# Patient Record
Sex: Female | Born: 1970 | Race: Black or African American | Hispanic: No | Marital: Single | Smoking: Never smoker
Health system: Southern US, Community
[De-identification: ages and names within clinical notes are randomized; demographics above are authoritative.]

## PROBLEM LIST (undated history)

## (undated) DIAGNOSIS — L309 Dermatitis, unspecified: Secondary | ICD-10-CM

---

## 2008-09-01 ENCOUNTER — Inpatient Hospital Stay (HOSPITAL_COMMUNITY): Admission: AD | Admit: 2008-09-01 | Discharge: 2008-09-01 | Payer: Self-pay | Admitting: Obstetrics & Gynecology

## 2008-09-24 ENCOUNTER — Ambulatory Visit: Payer: Self-pay | Admitting: Obstetrics & Gynecology

## 2008-09-24 ENCOUNTER — Encounter: Payer: Self-pay | Admitting: Obstetrics & Gynecology

## 2008-09-24 ENCOUNTER — Other Ambulatory Visit: Admission: RE | Admit: 2008-09-24 | Discharge: 2008-09-24 | Payer: Self-pay | Admitting: Obstetrics & Gynecology

## 2008-09-24 LAB — CONVERTED CEMR LAB
Estradiol: 288.6 pg/mL
FSH: 14 milliintl units/mL
Platelets: 282 10*3/uL (ref 150–400)
Prolactin: 8.6 ng/mL
RDW: 18.5 % — ABNORMAL HIGH (ref 11.5–15.5)
TSH: 2.194 microintl units/mL (ref 0.350–4.500)
WBC: 4.8 10*3/uL (ref 4.0–10.5)

## 2008-12-07 ENCOUNTER — Inpatient Hospital Stay (HOSPITAL_COMMUNITY): Admission: RE | Admit: 2008-12-07 | Discharge: 2008-12-09 | Payer: Self-pay | Admitting: Obstetrics and Gynecology

## 2008-12-07 ENCOUNTER — Ambulatory Visit: Payer: Self-pay | Admitting: Obstetrics & Gynecology

## 2008-12-07 ENCOUNTER — Encounter: Payer: Self-pay | Admitting: Obstetrics & Gynecology

## 2010-04-06 ENCOUNTER — Emergency Department (HOSPITAL_COMMUNITY)
Admission: EM | Admit: 2010-04-06 | Discharge: 2010-04-06 | Disposition: A | Discharge status: Home / Self Care | Payer: Medicaid Other | Attending: Emergency Medicine | Admitting: Emergency Medicine

## 2010-04-06 DIAGNOSIS — H5789 Other specified disorders of eye and adnexa: Secondary | ICD-10-CM | POA: Insufficient documentation

## 2010-04-06 DIAGNOSIS — H5710 Ocular pain, unspecified eye: Secondary | ICD-10-CM | POA: Insufficient documentation

## 2010-04-06 DIAGNOSIS — H1045 Other chronic allergic conjunctivitis: Secondary | ICD-10-CM | POA: Insufficient documentation

## 2010-04-06 DIAGNOSIS — H538 Other visual disturbances: Secondary | ICD-10-CM | POA: Insufficient documentation

## 2010-04-06 DIAGNOSIS — L259 Unspecified contact dermatitis, unspecified cause: Secondary | ICD-10-CM | POA: Insufficient documentation

## 2010-04-13 LAB — BASIC METABOLIC PANEL
BUN: 8 mg/dL (ref 6–23)
CO2: 23 mEq/L (ref 19–32)
Calcium: 9 mg/dL (ref 8.4–10.5)
Creatinine, Ser: 0.67 mg/dL (ref 0.4–1.2)
Glucose, Bld: 90 mg/dL (ref 70–99)

## 2010-04-13 LAB — MRSA CULTURE

## 2010-04-13 LAB — CBC
Hemoglobin: 10 g/dL — ABNORMAL LOW (ref 12.0–15.0)
Hemoglobin: 7.7 g/dL — ABNORMAL LOW (ref 12.0–15.0)
MCHC: 32.2 g/dL (ref 30.0–36.0)
MCHC: 32.3 g/dL (ref 30.0–36.0)
Platelets: 379 10*3/uL (ref 150–400)
RBC: 3 MIL/uL — ABNORMAL LOW (ref 3.87–5.11)
RDW: 16.2 % — ABNORMAL HIGH (ref 11.5–15.5)

## 2010-04-13 LAB — CROSSMATCH
ABO/RH(D): O POS
Antibody Screen: NEGATIVE

## 2010-04-16 LAB — POCT PREGNANCY, URINE: Preg Test, Ur: NEGATIVE

## 2010-04-16 LAB — CBC
HCT: 33.9 % — ABNORMAL LOW (ref 36.0–46.0)
Hemoglobin: 10.7 g/dL — ABNORMAL LOW (ref 12.0–15.0)
WBC: 3.5 10*3/uL — ABNORMAL LOW (ref 4.0–10.5)

## 2010-08-13 ENCOUNTER — Emergency Department (HOSPITAL_COMMUNITY)
Admission: EM | Admit: 2010-08-13 | Discharge: 2010-08-14 | Disposition: A | Discharge status: Home / Self Care | Payer: Medicaid Other | Attending: Emergency Medicine | Admitting: Emergency Medicine

## 2010-08-13 DIAGNOSIS — H11429 Conjunctival edema, unspecified eye: Secondary | ICD-10-CM | POA: Insufficient documentation

## 2010-08-13 DIAGNOSIS — L259 Unspecified contact dermatitis, unspecified cause: Secondary | ICD-10-CM | POA: Insufficient documentation

## 2010-08-13 DIAGNOSIS — H109 Unspecified conjunctivitis: Secondary | ICD-10-CM | POA: Insufficient documentation

## 2010-08-13 DIAGNOSIS — H5789 Other specified disorders of eye and adnexa: Secondary | ICD-10-CM | POA: Insufficient documentation

## 2011-05-17 ENCOUNTER — Other Ambulatory Visit: Payer: Self-pay | Admitting: *Deleted

## 2011-05-17 DIAGNOSIS — Z1231 Encounter for screening mammogram for malignant neoplasm of breast: Secondary | ICD-10-CM

## 2011-05-18 ENCOUNTER — Ambulatory Visit: Payer: Medicaid Other

## 2011-05-23 ENCOUNTER — Ambulatory Visit: Payer: Medicaid Other

## 2011-06-19 ENCOUNTER — Ambulatory Visit: Payer: Medicaid Other | Admitting: Family Medicine

## 2014-06-12 ENCOUNTER — Encounter (HOSPITAL_COMMUNITY): Payer: Self-pay | Admitting: Cardiology

## 2014-06-12 ENCOUNTER — Emergency Department (HOSPITAL_COMMUNITY)
Admission: EM | Admit: 2014-06-12 | Discharge: 2014-06-12 | Disposition: A | Discharge status: Home / Self Care | Payer: Medicaid Other | Attending: Emergency Medicine | Admitting: Emergency Medicine

## 2014-06-12 DIAGNOSIS — R21 Rash and other nonspecific skin eruption: Secondary | ICD-10-CM | POA: Diagnosis present

## 2014-06-12 DIAGNOSIS — L309 Dermatitis, unspecified: Secondary | ICD-10-CM | POA: Diagnosis not present

## 2014-06-12 HISTORY — DX: Dermatitis, unspecified: L30.9

## 2014-06-12 MED ORDER — CLOBETASOL PROPIONATE 0.05 % EX CREA: 1.0000 "application " | TOPICAL_CREAM | Freq: Two times a day (BID) | CUTANEOUS | Status: DC

## 2014-06-12 NOTE — ED Notes (Signed)
Pt reports her eczema has been flared for the past couple of days. Reports she has spent some time in the sun and has made it worse. Pt is out of medication at home.

## 2014-06-12 NOTE — Discharge Instructions (Signed)
Eczema Eczema, also called atopic dermatitis, is a skin disorder that causes inflammation of the skin. It causes a red rash and dry, scaly skin. The skin becomes very itchy. Eczema is generally worse during the cooler winter months and often improves with the warmth of summer. Eczema usually starts showing signs in infancy. Some children outgrow eczema, but it may last through adulthood.  CAUSES  The exact cause of eczema is not known, but it appears to run in families. People with eczema often have a family history of eczema, allergies, asthma, or hay fever. Eczema is not contagious. Flare-ups of the condition may be caused by:   Contact with something you are sensitive or allergic to.   Stress. SIGNS AND SYMPTOMS  Dry, scaly skin.   Red, itchy rash.   Itchiness. This may occur before the skin rash and may be very intense.  DIAGNOSIS  The diagnosis of eczema is usually made based on symptoms and medical history. TREATMENT  Eczema cannot be cured, but symptoms usually can be controlled with treatment and other strategies. A treatment plan might include:  Controlling the itching and scratching.   Use over-the-counter antihistamines as directed for itching. This is especially useful at night when the itching tends to be worse.   Use over-the-counter steroid creams as directed for itching.   Avoid scratching. Scratching makes the rash and itching worse. It may also result in a skin infection (impetigo) due to a break in the skin caused by scratching.   Keeping the skin well moisturized with creams every day. This will seal in moisture and help prevent dryness. Lotions that contain alcohol and water should be avoided because they can dry the skin.   Limiting exposure to things that you are sensitive or allergic to (allergens).   Recognizing situations that cause stress.   Developing a plan to manage stress.  HOME CARE INSTRUCTIONS   Only take over-the-counter or  prescription medicines as directed by your health care provider.   Do not use anything on the skin without checking with your health care provider.   Keep baths or showers short (5 minutes) in warm (not hot) water. Use mild cleansers for bathing. These should be unscented. You may add nonperfumed bath oil to the bath water. It is best to avoid soap and bubble bath.   Immediately after a bath or shower, when the skin is still damp, apply a moisturizing ointment to the entire body. This ointment should be a petroleum ointment. This will seal in moisture and help prevent dryness. The thicker the ointment, the better. These should be unscented.   Keep fingernails cut short. Children with eczema may need to wear soft gloves or mittens at night after applying an ointment.   Dress in clothes made of cotton or cotton blends. Dress lightly, because heat increases itching.   A child with eczema should stay away from anyone with fever blisters or cold sores. The virus that causes fever blisters (herpes simplex) can cause a serious skin infection in children with eczema. SEEK MEDICAL CARE IF:   Your itching interferes with sleep.   Your rash gets worse or is not better within 1 week after starting treatment.   You see pus or soft yellow scabs in the rash area.   You have a fever.   You have a rash flare-up after contact with someone who has fever blisters.  Document Released: 12/24/1999 Document Revised: 10/16/2012 Document Reviewed: 07/29/2012 Exit[employer] Patient Information 2015 [employer]. This information   is not intended to replace advice given to you by your health care provider. Make sure you discuss any questions you have with your health care provider.  

## 2014-06-12 NOTE — ED Provider Notes (Signed)
CSN: [id number]     Arrival date & time 06/12/14  1354 History   This chart was scribed for Carol HelperBowie Lanay Zinda, PA-C working with Doug SouSam Jacubowitz, MD by Evon Slackerrance Branch, ED Scribe. This patient was seen in room TR10C/TR10C and the patient's care was started at 2:44 PM.     Chief Complaint  Patient presents with  . Rash   Patient is a 44 y.o. female presenting with rash. The history is provided by the patient. No language interpreter was used.  Rash Associated symptoms: no fever and no shortness of breath    HPI Comments: Carol Hernandez is a 44 y.o. female with PMHx of Eczema who presents to the Emergency Department complaining of worsening itchy rash onset 1 week. Pt states that the rash has spread to her bilateral arms, hands, legs, posterior neck and lower back. Pt associates the worsening of the rash with migraine HA she has had for the past week. Pt states she also believes that the rash is worse when exposed to direct sun light. Pt states she has tried Vaseline and warm showers with no relief. Pt states she has a Hx of eczema that has relief with cortisol cream. Pt states she has recently ran out of the cream about 3 weeks prior. Pt denies any changes in soaps or detergents. Pt denies fever, cp or SOB.     Past Medical History  Diagnosis Date  . Eczema    History reviewed. No pertinent past surgical history. History reviewed. No pertinent family history. History  Substance Use Topics  . Smoking status: Never Smoker   . Smokeless tobacco: Not on file  . Alcohol Use: No   OB History    No data available     Review of Systems  Constitutional: Negative for fever and chills.  Respiratory: Negative for shortness of breath.   Cardiovascular: Negative for chest pain.  Skin: Positive for rash.    Allergies  Ibuprofen  Home Medications   Prior to Admission medications   Not on File   BP 157/96 mmHg  Pulse 72  Temp(Src) 98.4 F (36.9 C)  Resp 18  Ht 5\' 2"  (1.575 m)  SpO2 100%    Physical Exam  Constitutional: She is oriented to person, place, and time. She appears well-developed and well-nourished. No distress.  HENT:  Head: Normocephalic and atraumatic.  Eyes: Conjunctivae and EOM are normal.  Neck: Neck supple. No tracheal deviation present.  Cardiovascular: Normal rate.   Pulmonary/Chest: Effort normal and breath sounds normal. No respiratory distress. She has no wheezes. She has no rales.  Musculoskeletal: Normal range of motion.  Neurological: She is alert and oriented to person, place, and time.  Skin: Skin is warm and dry. Rash noted.  Dry patchy skin changes surrounding neck, effecting dorsum of both forearms and dorsum's of hands, posterior aspect of both lower legs and along lower back.  No rash noted in mouth, palms of hands or soles of feet  Psychiatric: She has a normal mood and affect. Her behavior is normal.  Nursing note and vitals reviewed.   ED Course  Procedures (including critical care time) DIAGNOSTIC STUDIES: Oxygen Saturation is 100% on RA, normal by my interpretation.    COORDINATION OF CARE: 3:21 PM-Discussed treatment plan with pt at bedside and pt agreed to plan.   Evidence of eczema, no signs of infection. Refill clobetasol cream as it has helped her in the past. Recommend dermatology follow-up as needed.  Labs Review Labs Reviewed -  No data to display  Imaging Review No results found.   EKG Interpretation None      MDM   Final diagnoses:  Eczema   BP 150/101 mmHg  Pulse 91  Temp(Src) 98.4 F (36.9 C) (Oral)  Resp 18  Ht  (1.575 m)  SpO2 100%   I personally performed the services described in this documentation, which was scribed in my presence. The recorded information has been reviewed and is accurate.       Carol Helper, PA-C 06/12/14 2322  Doug Sou, MD 06/13/14 [phone]

## 2014-09-23 ENCOUNTER — Emergency Department (HOSPITAL_COMMUNITY)
Admission: EM | Admit: 2014-09-23 | Discharge: 2014-09-23 | Disposition: A | Discharge status: Home / Self Care | Payer: Medicaid Other | Attending: Emergency Medicine | Admitting: Emergency Medicine

## 2014-09-23 ENCOUNTER — Encounter (HOSPITAL_COMMUNITY): Payer: Self-pay | Admitting: Neurology

## 2014-09-23 DIAGNOSIS — R21 Rash and other nonspecific skin eruption: Secondary | ICD-10-CM | POA: Diagnosis present

## 2014-09-23 DIAGNOSIS — L309 Dermatitis, unspecified: Secondary | ICD-10-CM

## 2014-09-23 DIAGNOSIS — L259 Unspecified contact dermatitis, unspecified cause: Secondary | ICD-10-CM | POA: Diagnosis not present

## 2014-09-23 MED ORDER — CLOBETASOL PROPIONATE 0.05 % EX OINT: 1.0000 "application " | TOPICAL_OINTMENT | Freq: Two times a day (BID) | CUTANEOUS | Status: AC

## 2014-09-23 NOTE — Discharge Instructions (Signed)
Eczema Eczema, also called atopic dermatitis, is a skin disorder that causes inflammation of the skin. It causes a red rash and dry, scaly skin. The skin becomes very itchy. Eczema is generally worse during the cooler winter months and often improves with the warmth of summer. Eczema usually starts showing signs in infancy. Some children outgrow eczema, but it may last through adulthood.  CAUSES  The exact cause of eczema is not known, but it appears to run in families. People with eczema often have a family history of eczema, allergies, asthma, or hay fever. Eczema is not contagious. Flare-ups of the condition may be caused by:   Contact with something you are sensitive or allergic to.   Stress. SIGNS AND SYMPTOMS  Dry, scaly skin.   Red, itchy rash.   Itchiness. This may occur before the skin rash and may be very intense.  DIAGNOSIS  The diagnosis of eczema is usually made based on symptoms and medical history. TREATMENT  Eczema cannot be cured, but symptoms usually can be controlled with treatment and other strategies. A treatment plan might include:  Controlling the itching and scratching.   Use over-the-counter antihistamines as directed for itching. This is especially useful at night when the itching tends to be worse.   Use over-the-counter steroid creams as directed for itching.   Avoid scratching. Scratching makes the rash and itching worse. It may also result in a skin infection (impetigo) due to a break in the skin caused by scratching.   Keeping the skin well moisturized with creams every day. This will seal in moisture and help prevent dryness. Lotions that contain alcohol and water should be avoided because they can dry the skin.   Limiting exposure to things that you are sensitive or allergic to (allergens).   Recognizing situations that cause stress.   Developing a plan to manage stress.  HOME CARE INSTRUCTIONS   Only take over-the-counter or  prescription medicines as directed by your health care provider.   Do not use anything on the skin without checking with your health care provider.   Keep baths or showers short (5 minutes) in warm (not hot) water. Use mild cleansers for bathing. These should be unscented. You may add nonperfumed bath oil to the bath water. It is best to avoid soap and bubble bath.   Immediately after a bath or shower, when the skin is still damp, apply a moisturizing ointment to the entire body. This ointment should be a petroleum ointment. This will seal in moisture and help prevent dryness. The thicker the ointment, the better. These should be unscented.   Keep fingernails cut short. Children with eczema may need to wear soft gloves or mittens at night after applying an ointment.   Dress in clothes made of cotton or cotton blends. Dress lightly, because heat increases itching.   A child with eczema should stay away from anyone with fever blisters or cold sores. The virus that causes fever blisters (herpes simplex) can cause a serious skin infection in children with eczema. SEEK MEDICAL CARE IF:   Your itching interferes with sleep.   Your rash gets worse or is not better within 1 week after starting treatment.   You see pus or soft yellow scabs in the rash area.   You have a fever.   You have a rash flare-up after contact with someone who has fever blisters.  Document Released: 12/24/1999 Document Revised: 10/16/2012 Document Reviewed: 07/29/2012 Exit[employer] Patient Information 2015 [employer]. This information   is not intended to replace advice given to you by your health care provider. Make sure you discuss any questions you have with your health care provider.  

## 2014-09-23 NOTE — ED Provider Notes (Signed)
CSN: [id number]     Arrival date & time 09/23/14  1524 History  This chart was scribed for Fayrene Helper, PA-C, working with Donnetta Hutching, MD by Chestine Spore, ED Scribe. The patient was seen in room TR03C/TR03C at 4:09 PM.    Chief Complaint  Patient presents with  . Eczema     The history is provided by the patient. No language interpreter was used.    Carol Hernandez is a 44 y.o. female with a medical hx of eczema who presents to the Emergency Department complaining of eczema onset 2 weeks. She reports that the eczema is to her posterior neck and back of her right knee that is itchy. Pt notes that she was not given a referral to a dermatologist at her last visit in June 2016 for her similar symptoms. Pt notes that her PCP is [facility] and she has tried to get an appointment with no success. She thinks that her eczema is worsened by sunlight. She denies any new medications/lotions/soaps/detergents. She notes that she has tried [employer], vaseline, and the prescribed temovate with relief of her symptoms. She denies wound, color change, fever, abdominal pain, throat swelling, and any other symptoms. she denies hx of DM at this time.   Past Medical History  Diagnosis Date  . Eczema    History reviewed. No pertinent past surgical history. No family history on file. Social History  Substance Use Topics  . Smoking status: Never Smoker   . Smokeless tobacco: None  . Alcohol Use: No   OB History    No data available     Review of Systems  Constitutional: Negative for fever.  HENT: Negative for trouble swallowing.   Gastrointestinal: Negative for abdominal pain.  Skin: Positive for rash. Negative for color change.      Allergies  Ibuprofen  Home Medications   Prior to Admission medications   Medication Sig Start Date End Date Taking? Authorizing Provider  clobetasol cream (TEMOVATE) 0.05 % Apply 1 application topically 2 (two) times daily. Apply to affected area twice daily as  needed.  Avoid use on face. 06/12/14   Fayrene Helper, PA-C   BP 154/99 mmHg  Pulse 96  Temp(Src) 98.4 F (36.9 C) (Oral)  Resp 16  Ht 5\' 3"  (1.6 m)  Wt 233 lb 3.2 oz (105.779 kg)  BMI 41.32 kg/m2  SpO2 100% Physical Exam  Constitutional: She is oriented to person, place, and time. She appears well-developed and well-nourished. No distress.  HENT:  Head: Normocephalic and atraumatic.  Eyes: EOM are normal.  Neck: Neck supple. No tracheal deviation present.  Cardiovascular: Normal rate, regular rhythm and normal heart sounds.  Exam reveals no gallop and no friction rub.   No murmur heard. Pulmonary/Chest: Effort normal and breath sounds normal. No respiratory distress. She has no wheezes. She has no rales.  Musculoskeletal: Normal range of motion.  Neurological: She is alert and oriented to person, place, and time.  Skin: Skin is warm and dry. Rash noted.  Dry eczema rash noted to chest, posterior neck, bilateral shoulders, bilateral forearm, and bilateral legs. No rash to soles of feet, palms, or oral region.  Psychiatric: She has a normal mood and affect. Her behavior is normal.  Nursing note and vitals reviewed.   ED Course  Procedures (including critical care time) DIAGNOSTIC STUDIES: Oxygen Saturation is 100% on RA, nl by my interpretation.    COORDINATION OF CARE: 4:14 PM- This is probably an exacerbation of patients eczema.  Will treat with Temovate and a referral to a dermatologist. Discussed treatment plan with pt at bedside which includes temovate Rx and pt agreed to plan.     MDM   Final diagnoses:  Eczema    BP 154/99 mmHg  Pulse 96  Temp(Src) 98.4 F (36.9 C) (Oral)  Resp 16  Ht  (1.6 m)  Wt 233 lb 3.2 oz (105.779 kg)  BMI 41.32 kg/m2  SpO2 100%   I personally performed the services described in this documentation, which was scribed in my presence. The recorded information has been reviewed and is accurate.     Fayrene Helper, PA-C 09/23/14  1638  Donnetta Hutching, MD 09/23/14 [phone]

## 2014-09-23 NOTE — ED Notes (Signed)
Pt reports eczema to her arms and back, is itchy.

## 2014-09-23 NOTE — ED Notes (Signed)
Rash on upper chest  And under neck

## 2015-04-20 ENCOUNTER — Emergency Department (HOSPITAL_COMMUNITY): Payer: Medicaid Other

## 2015-04-20 ENCOUNTER — Emergency Department (HOSPITAL_COMMUNITY)
Admission: EM | Admit: 2015-04-20 | Discharge: 2015-04-21 | Disposition: A | Discharge status: Home / Self Care | Payer: Medicaid Other | Attending: Emergency Medicine | Admitting: Emergency Medicine

## 2015-04-20 ENCOUNTER — Encounter (HOSPITAL_COMMUNITY): Payer: Self-pay | Admitting: Emergency Medicine

## 2015-04-20 DIAGNOSIS — Y9389 Activity, other specified: Secondary | ICD-10-CM | POA: Diagnosis not present

## 2015-04-20 DIAGNOSIS — Y998 Other external cause status: Secondary | ICD-10-CM | POA: Diagnosis not present

## 2015-04-20 DIAGNOSIS — Y9289 Other specified places as the place of occurrence of the external cause: Secondary | ICD-10-CM | POA: Diagnosis not present

## 2015-04-20 DIAGNOSIS — X58XXXA Exposure to other specified factors, initial encounter: Secondary | ICD-10-CM | POA: Diagnosis not present

## 2015-04-20 DIAGNOSIS — S63619A Unspecified sprain of unspecified finger, initial encounter: Secondary | ICD-10-CM

## 2015-04-20 DIAGNOSIS — Z872 Personal history of diseases of the skin and subcutaneous tissue: Secondary | ICD-10-CM | POA: Insufficient documentation

## 2015-04-20 DIAGNOSIS — S63612A Unspecified sprain of right middle finger, initial encounter: Secondary | ICD-10-CM | POA: Insufficient documentation

## 2015-04-20 DIAGNOSIS — S6991XA Unspecified injury of right wrist, hand and finger(s), initial encounter: Secondary | ICD-10-CM | POA: Diagnosis present

## 2015-04-20 MED ORDER — HYDROCODONE-ACETAMINOPHEN 5-325 MG PO TABS: 1.0000 | ORAL_TABLET | Freq: Four times a day (QID) | ORAL | Status: AC | PRN

## 2015-04-20 NOTE — ED Provider Notes (Signed)
History  By signing my name below, I, Karle PlumberJennifer Tensley, attest that this documentation has been prepared under the direction and in the presence of Rob RogersBrowning, [place]PA-C. Electronically Signed: Karle PlumberJennifer Tensley, ED Scribe. 04/20/2015. 11:33 PM.  Chief Complaint  Patient presents with  . Finger Injury   The history is provided by the patient and medical records. No language interpreter was used.    HPI Comments:  Carol Hernandez is a 45 y.o. obese female who presents to the Emergency Department complaining of right third digit swelling that began last night. She reports aching pain throughout her entire right hand. She denies trauma or injury but states she lifts boxes at work and may have jammed it against something and not realized it. She has not taken anything for pain but reports icing the area. Touching the area or moving the finger increases the pain. She denies alleviating factors. Pt denies numbness, tingling or weakness of the right hand or fingers, bruising, wounds, fever or chills.   Past Medical History  Diagnosis Date  . Eczema    History reviewed. No pertinent past surgical history. No family history on file. Social History  Substance Use Topics  . Smoking status: Never Smoker   . Smokeless tobacco: None  . Alcohol Use: No   OB History    No data available     Review of Systems  Constitutional: Negative for fever and chills.  Musculoskeletal: Positive for joint swelling and arthralgias.  Skin: Negative for color change and wound.  Neurological: Negative for weakness and numbness.   Allergies  Ibuprofen  Home Medications   Prior to Admission medications   Not on File   Triage Vitals: BP 156/99 mmHg  Pulse 95  Temp(Src) 98.1 F (36.7 C) (Oral)  Resp 18  Ht 5\' 2"  (1.575 m)  Wt 232 lb (105.235 kg)  BMI 42.42 kg/m2  SpO2 100% Physical Exam Physical Exam  Constitutional: Pt appears well-developed and well-nourished. No distress.  HENT:  Head:  Normocephalic and atraumatic.  Eyes: Conjunctivae are normal.  Neck: Normal range of motion.  Cardiovascular: Normal rate, regular rhythm and intact distal pulses.   Capillary refill < 3 sec  Pulmonary/Chest: Effort normal and breath sounds normal.  Musculoskeletal: Pt exhibits tenderness to palpation over right middle finger specifically at MCP and PIP. Pt exhibits no edema.  ROM: 4/5  Neurological: Pt  is alert. Coordination normal.  Sensation: 5/5 Strength: 4/5  Skin: Skin is warm and dry. Pt is not diaphoretic.  No tenting of the skin  Psychiatric: Pt has a normal mood and affect.  Nursing note and vitals reviewed.  ED Course  Procedures (including critical care time) DIAGNOSTIC STUDIES: Oxygen Saturation is 100% on RA, normal by my interpretation.   COORDINATION OF CARE: 10:37 PM- Will X-Ray right hand. Pt verbalizes understanding and agrees to plan.  Medications - No data to display   Imaging Review Dg Hand Complete Right  04/20/2015  CLINICAL DATA:  Right middle finger pain for 1 day. EXAM: RIGHT HAND - COMPLETE 3+ VIEW COMPARISON:  None. FINDINGS: There is no evidence of fracture or dislocation. There is no evidence of arthropathy or other focal bone abnormality. Soft tissues are unremarkable. IMPRESSION: Negative. Electronically Signed   By: Ellery Plunkaniel R Mitchell M.D.   On: 04/20/2015 23:22   I have personally reviewed and evaluated these images and lab results as part of my medical decision-making.    MDM   Final diagnoses:  Finger sprain, initial encounter  Patient with finger injury. She thinks that she hit her finger while at work. She may have jammed it. Plain films are negative. Will treat for finger sprain. There is no evidence of infection. Recommend primary care follow-up, or hand specialist follow-up if no primary care. Discharge to home. Return precautions given.  I personally performed the services described in this documentation, which was scribed in my  presence. The recorded information has been reviewed and is accurate.      Roxy Horseman, PA-C 04/20/15 2343  Lavera Guise, MD 04/21/15 [phone]

## 2015-04-20 NOTE — ED Notes (Signed)
Pt c/o pain and swelling to right middle finger, onset yesterday.  Pt st's she lifts boxes at work and st's she may have bumped it but does not remember

## 2015-04-20 NOTE — Discharge Instructions (Signed)
Finger Sprain °A finger sprain is a tear in one of the strong, fibrous tissues that connect the bones (ligaments) in your finger. The severity of the sprain depends on how much of the ligament is torn. The tear can be either partial or complete. °CAUSES  °Often, sprains are a result of a fall or accident. If you extend your hands to catch an object or to protect yourself, the force of the impact causes the fibers of your ligament to stretch too much. This excess tension causes the fibers of your ligament to tear. °SYMPTOMS  °You may have some loss of motion in your finger. Other symptoms include: °· Bruising. °· Tenderness. °· Swelling. °DIAGNOSIS  °In order to diagnose finger sprain, your caregiver will physically examine your finger or thumb to determine how torn the ligament is. Your caregiver may also suggest an X-ray exam of your finger to make sure no bones are broken. °TREATMENT  °If your ligament is only partially torn, treatment usually involves keeping the finger in a fixed position (immobilization) for a short period. To do this, your caregiver will apply a bandage, cast, or splint to keep your finger from moving until it heals. For a partially torn ligament, the healing process usually takes 2 to 3 weeks. °If your ligament is completely torn, you may need surgery to reconnect the ligament to the bone. After surgery a cast or splint will be applied and will need to stay on your finger or thumb for 4 to 6 weeks while your ligament heals. °HOME CARE INSTRUCTIONS °· Keep your injured finger elevated, when possible, to decrease swelling. °· To ease pain and swelling, apply ice to your joint twice a day, for 2 to 3 days: °· Put ice in a plastic bag. °· Place a towel between your skin and the bag. °· Leave the ice on for 15 minutes. °· Only take over-the-counter or prescription medicine for pain as directed by your caregiver. °· Do not wear rings on your injured finger. °· Do not leave your finger unprotected  until pain and stiffness go away (usually 3 to 4 weeks). °· Do not allow your cast or splint to get wet. Cover your cast or splint with a plastic bag when you shower or bathe. Do not swim. °· Your caregiver may suggest special exercises for you to do during your recovery to prevent or limit permanent stiffness. °SEEK IMMEDIATE MEDICAL CARE IF: °· Your cast or splint becomes damaged. °· Your pain becomes worse rather than better. °MAKE SURE YOU: °· Understand these instructions. °· Will watch your condition. °· Will get help right away if you are not doing well or get worse. °  °This information is not intended to replace advice given to you by your health care provider. Make sure you discuss any questions you have with your health care provider. °  °Document Released: 02/03/2004 Document Revised: 01/16/2014 Document Reviewed: 08/29/2010 °Elsevier Interactive Patient Education ©2016 [employer] ° °Jammed Finger °A jammed finger is an injury to the ligaments that support your finger bones. Ligaments are strong bands of tissue that connect bones and keep them in place. This injury happens when the ligaments are stretched beyond their normal range of motion (sprained). °CAUSES  °A jammed finger is caused by a hard direct hit to the tip of your finger that pushes your finger toward your hand.  °RISK FACTORS °This injury is more likely to happen if you play sports. °SYMPTOMS  °Symptoms of a jammed finger include: °·   Pain. °· Swelling. °· Discoloration and bruising around the joint. °· Difficulty bending or straightening the finger. °· Not being able to use the finger normally. °DIAGNOSIS  °A jammed finger is diagnosed with a medical history and physical exam. You may also have X-rays taken to check for a broken bone (fracture).  °TREATMENT  °Treatment for a jammed finger may include: °· Wearing a splint. °· Taping the injured finger to the fingers beside it (buddy taping). °· Medicines used to treat pain. °Depending on  the type of injury, you may have to do exercises after your finger has begun to heal. This helps you regain strength and mobility in the finger.  °HOME CARE INSTRUCTIONS  °· Take medicines only as directed by your health care provider. °· Apply ice to the injured area:   °¨ Put ice in a plastic bag.   °¨ Place a towel between your skin and the bag.   °¨ Leave the ice on for 20 minutes, 2-3 times per day. °· Raise the injured area above the level of your heart while you are sitting or lying down. °· Wear the splint or tape as directed by your health care provider. Remove it only as directed by your health care provider. °· Rest your finger until your health care provider says you can move it again. Your finger may feel stiff and painful for a while. °· Perform strengthening exercises as directed by your health care provider. It may help to start doing these exercises with your hand in a bowl of warm water. °· Keep all follow-up visits as directed by your health care provider. This is important. °SEEK MEDICAL CARE IF: °· You have pain or swelling that is getting worse. °· Your finger feels cold. °· Your finger looks out of place at the joint (deformity). °· You still cannot extend your finger after treatment. °· You have a fever. °SEEK IMMEDIATE MEDICAL CARE IF:  °· Even after loosening your splint, your finger: °¨ Is very red and swollen. °¨ Is white or blue. °¨ Feels tingly or becomes numb. °  °This information is not intended to replace advice given to you by your health care provider. Make sure you discuss any questions you have with your health care provider. °  °Document Released: 06/15/2009 Document Revised: 01/16/2014 Document Reviewed: 10/29/2013 °Elsevier Interactive Patient Education ©2016 [employer] ° °

## 2015-04-21 NOTE — ED Notes (Signed)
Patient able to ambulate independently  

## 2015-09-10 ENCOUNTER — Encounter (HOSPITAL_COMMUNITY): Payer: Self-pay | Admitting: *Deleted

## 2015-09-10 ENCOUNTER — Emergency Department (HOSPITAL_COMMUNITY)
Admission: EM | Admit: 2015-09-10 | Discharge: 2015-09-10 | Disposition: A | Discharge status: Home / Self Care | Payer: Medicaid Other | Attending: Emergency Medicine | Admitting: Emergency Medicine

## 2015-09-10 DIAGNOSIS — R030 Elevated blood-pressure reading, without diagnosis of hypertension: Secondary | ICD-10-CM | POA: Diagnosis not present

## 2015-09-10 DIAGNOSIS — L309 Dermatitis, unspecified: Secondary | ICD-10-CM | POA: Diagnosis not present

## 2015-09-10 DIAGNOSIS — R21 Rash and other nonspecific skin eruption: Secondary | ICD-10-CM | POA: Diagnosis present

## 2015-09-10 DIAGNOSIS — IMO0001 Reserved for inherently not codable concepts without codable children: Secondary | ICD-10-CM

## 2015-09-10 LAB — I-STAT CHEM 8, ED
BUN: 8 mg/dL (ref 6–20)
Calcium, Ion: 1.18 mmol/L (ref 1.15–1.40)
Chloride: 106 mmol/L (ref 101–111)
Creatinine, Ser: 0.9 mg/dL (ref 0.44–1.00)
Glucose, Bld: 91 mg/dL (ref 65–99)
HEMATOCRIT: 38 % (ref 36.0–46.0)
Hemoglobin: 12.9 g/dL (ref 12.0–15.0)
Potassium: 3.6 mmol/L (ref 3.5–5.1)
SODIUM: 143 mmol/L (ref 135–145)
TCO2: 25 mmol/L (ref 0–100)

## 2015-09-10 MED ORDER — AMLODIPINE BESYLATE 5 MG PO TABS
5.0000 mg | ORAL_TABLET | Freq: Once | ORAL | Status: AC
  Administered 2015-09-10: 5 mg via ORAL
  Filled 2015-09-10: qty 1

## 2015-09-10 MED ORDER — CLOBETASOL PROPIONATE 0.05 % EX OINT: 1.0000 "application " | TOPICAL_OINTMENT | Freq: Two times a day (BID) | CUTANEOUS | 0 refills | Status: AC

## 2015-09-10 MED ORDER — AMLODIPINE BESYLATE 5 MG PO TABS: 5.0000 mg | ORAL_TABLET | Freq: Every day | ORAL | 0 refills | Status: AC

## 2015-09-10 NOTE — ED Triage Notes (Signed)
The pt has eczema she has had a flareup of this for the past 2 weeks

## 2015-09-10 NOTE — ED Provider Notes (Signed)
[facility]-EMERGENCY DEPT Provider Note   CSN: [id number] Arrival date & time: 09/10/15  2045  By signing my name below, I, Christy Sartorius, attest that this documentation has been prepared under the direction and in the presence of [facility], PA-C. Electronically Signed: Christy Sartorius, ED Scribe. 09/10/15. 2:06 AM.  History   Chief Complaint Chief Complaint  Patient presents with  . Rash   The history is provided by the patient and medical records. No language interpreter was used.    HPI Comments:  Carol Hernandez is a 45 y.o. female who presents to the Emergency Department complaining of a pruritic eczema flair up on her abdomen, the back of her hands and her neck beginning 1-2 weeks ago.  She has had diagnosed eczema for as long as she can remember.  She is prescribed clobetasol for her eczema, however she is out of her cream so she has not been able to use it over the last two weeks. She had a BP of 173/117 in the ED today.  She states her mother had high blood pressure and her older sister is on high blood pressure medications, however she has not been diagnosed with hypertension. She has seen PCP several months ago where her BP was elevated. She states they discussed dietary changes which she has implemented but was not started on medication. She denies headache, SOB, chest pain, visual changes and abdominal pain. She has an appointment with her PCP on 09/24/15 and agrees to discuss her blood pressure at this visit.    Past Medical History:  Diagnosis Date  . Eczema     There are no active problems to display for this patient.   History reviewed. No pertinent surgical history.  OB History    No data available       Home Medications    Prior to Admission medications   Medication Sig Start Date End Date Taking? Authorizing Provider  amLODipine (NORVASC) 5 MG tablet Take 1 tablet (5 mg total) by mouth daily. 09/10/15   Chase Picket Jessye Imhoff, PA-C  clobetasol ointment  (TEMOVATE) 0.05 % Apply 1 application topically 2 (two) times daily. 09/10/15   Chase Picket Montae Stager, PA-C  HYDROcodone-acetaminophen (NORCO/VICODIN) 5-325 MG tablet Take 1 tablet by mouth every 6 (six) hours as needed. 04/20/15   Roxy Horseman, PA-C    Family History No family history on file.  Social History Social History  Substance Use Topics  . Smoking status: Never Smoker  . Smokeless tobacco: Never Used  . Alcohol use No     Allergies   Ibuprofen   Review of Systems Review of Systems  Respiratory: Negative for shortness of breath.   Cardiovascular: Negative for chest pain.  Gastrointestinal: Negative for abdominal pain.  Skin: Positive for rash.  Neurological: Negative for headaches.     Physical Exam Updated Vital Signs BP (!) 181/113 (BP Location: Left Arm)   Pulse 84   Temp 98.1 F (36.7 C) (Oral)   Resp 20   SpO2 100%   Physical Exam  Constitutional: She is oriented to person, place, and time. She appears well-developed and well-nourished. No distress.  HENT:  Head: Normocephalic and atraumatic.  Cardiovascular: Normal rate, regular rhythm, normal heart sounds and intact distal pulses.  Exam reveals no gallop and no friction rub.   No murmur heard. Pulmonary/Chest: Effort normal and breath sounds normal. No respiratory distress. She has no wheezes. She has no rales. She exhibits no tenderness.  Abdominal: Soft. She exhibits no distension.  There is no tenderness.  Musculoskeletal: She exhibits no edema.  Neurological: She is alert and oriented to person, place, and time.  Skin: Skin is warm and dry.  Dry scaly rash to bilateral hands, neck and abdomen.   Nursing note and vitals reviewed.    ED Treatments / Results   DIAGNOSTIC STUDIES:  Oxygen Saturation is 100% on RA, NML by my interpretation.    COORDINATION OF CARE:  2:06 AM Discussed treatment plan with pt at bedside and pt agreed to plan.  Labs (all labs ordered are listed, but only  abnormal results are displayed) Labs Reviewed  I-STAT CHEM 8, ED    EKG  EKG Interpretation None       Radiology No results found.  Procedures Procedures (including critical care time)  Medications Ordered in ED Medications  amLODipine (NORVASC) tablet 5 mg (5 mg Oral Given 09/10/15 2213)     Initial Impression / Assessment and Plan / ED Course  I have reviewed the triage vital signs and the nursing notes.  Pertinent labs & imaging results that were available during my care of the patient were reviewed by me and considered in my medical decision making (see chart for details).  Clinical Course   Carol Hernandez presents to ED for rash consistent with eczema. She has a history of eczema and typically on clobetasol ointment, however she has run out of this and requesting refill. Patient was extremely hypertensive in ED at 189/114 however asymptomatic. No history of diagnosed hypertension, however did have an elevated BP at PCP once and dietary changes were implemented. Chem-8 ordered and reassuring with normal kidney function. Patient started on Norvasc. She has a follow-up appointment with her PCP already scheduled in 2 weeks and I strongly encouraged her to keep this appointment and discuss blood pressure control. Reasons to return to the ED were discussed and all questions answered.   Final Clinical Impressions(s) / ED Diagnoses   Final diagnoses:  Eczema  Elevated blood pressure    New Prescriptions Discharge Medication List as of 09/10/2015 11:17 PM    START taking these medications   Details  amLODipine (NORVASC) 5 MG tablet Take 1 tablet (5 mg total) by mouth daily., Starting Fri 09/10/2015, Print    clobetasol ointment (TEMOVATE) 0.05 % Apply 1 application topically 2 (two) times daily., Starting Fri 09/10/2015, Print       I personally performed the services described in this documentation, which was scribed in my presence. The recorded information has been  reviewed and is accurate.    [facility] Pilcher Norvin Ohlin, PA-C 09/11/15 0206    Blane OharaJoshua Zavitz, MD 09/17/15 [phone]

## 2015-09-10 NOTE — Discharge Instructions (Signed)
I have refilled your topical steroid cream. Keep your scheduled appointment with your primary care provider. Your blood pressure was very high today. We obtained a chemistry panel which was normal. You were started on blood pressure medication. It is very important for you to discuss this medication with your primary care provider.  Return to ER for new or worsening symptoms, any additional concerns.

## 2017-07-19 ENCOUNTER — Other Ambulatory Visit: Payer: Self-pay | Admitting: Family Medicine

## 2017-07-19 DIAGNOSIS — Z713 Dietary counseling and surveillance: Secondary | ICD-10-CM | POA: Diagnosis not present

## 2017-07-19 DIAGNOSIS — L309 Dermatitis, unspecified: Secondary | ICD-10-CM | POA: Diagnosis not present

## 2017-07-19 DIAGNOSIS — I10 Essential (primary) hypertension: Secondary | ICD-10-CM | POA: Diagnosis not present

## 2017-07-19 DIAGNOSIS — Z1231 Encounter for screening mammogram for malignant neoplasm of breast: Secondary | ICD-10-CM

## 2017-07-19 DIAGNOSIS — E669 Obesity, unspecified: Secondary | ICD-10-CM | POA: Diagnosis not present

## 2017-08-14 ENCOUNTER — Ambulatory Visit
Admission: RE | Admit: 2017-08-14 | Discharge: 2017-08-14 | Disposition: A | Discharge status: Home / Self Care | Payer: Medicaid Other | Source: Ambulatory Visit | Attending: Family Medicine | Admitting: Family Medicine

## 2017-08-14 DIAGNOSIS — Z1231 Encounter for screening mammogram for malignant neoplasm of breast: Secondary | ICD-10-CM | POA: Diagnosis not present

## 2017-08-15 ENCOUNTER — Other Ambulatory Visit: Payer: Self-pay | Admitting: Family Medicine

## 2017-08-15 DIAGNOSIS — R928 Other abnormal and inconclusive findings on diagnostic imaging of breast: Secondary | ICD-10-CM

## 2017-08-20 ENCOUNTER — Ambulatory Visit
Admission: RE | Admit: 2017-08-20 | Discharge: 2017-08-20 | Disposition: A | Discharge status: Home / Self Care | Payer: Medicaid Other | Source: Ambulatory Visit | Attending: Family Medicine | Admitting: Family Medicine

## 2017-08-20 DIAGNOSIS — N6489 Other specified disorders of breast: Secondary | ICD-10-CM | POA: Diagnosis not present

## 2017-08-20 DIAGNOSIS — R928 Other abnormal and inconclusive findings on diagnostic imaging of breast: Secondary | ICD-10-CM | POA: Diagnosis not present

## 2017-10-31 DIAGNOSIS — L309 Dermatitis, unspecified: Secondary | ICD-10-CM | POA: Diagnosis not present

## 2017-10-31 DIAGNOSIS — E669 Obesity, unspecified: Secondary | ICD-10-CM | POA: Diagnosis not present

## 2017-10-31 DIAGNOSIS — I10 Essential (primary) hypertension: Secondary | ICD-10-CM | POA: Diagnosis not present

## 2017-10-31 DIAGNOSIS — E785 Hyperlipidemia, unspecified: Secondary | ICD-10-CM | POA: Diagnosis not present

## 2018-03-22 DIAGNOSIS — R55 Syncope and collapse: Secondary | ICD-10-CM | POA: Diagnosis not present

## 2018-03-22 DIAGNOSIS — R000 Tachycardia, unspecified: Secondary | ICD-10-CM | POA: Diagnosis not present

## 2018-03-23 ENCOUNTER — Emergency Department (HOSPITAL_COMMUNITY)
Admission: EM | Admit: 2018-03-23 | Discharge: 2018-03-23 | Disposition: A | Discharge status: Home / Self Care | Payer: Medicaid Other | Attending: Emergency Medicine | Admitting: Emergency Medicine

## 2018-03-23 ENCOUNTER — Other Ambulatory Visit: Payer: Self-pay

## 2018-03-23 ENCOUNTER — Emergency Department (HOSPITAL_COMMUNITY): Payer: Medicaid Other

## 2018-03-23 ENCOUNTER — Encounter (HOSPITAL_COMMUNITY): Payer: Self-pay | Admitting: Emergency Medicine

## 2018-03-23 DIAGNOSIS — M79672 Pain in left foot: Secondary | ICD-10-CM | POA: Diagnosis not present

## 2018-03-23 DIAGNOSIS — X501XXA Overexertion from prolonged static or awkward postures, initial encounter: Secondary | ICD-10-CM | POA: Diagnosis not present

## 2018-03-23 DIAGNOSIS — Z79899 Other long term (current) drug therapy: Secondary | ICD-10-CM | POA: Insufficient documentation

## 2018-03-23 DIAGNOSIS — Y999 Unspecified external cause status: Secondary | ICD-10-CM | POA: Insufficient documentation

## 2018-03-23 DIAGNOSIS — R42 Dizziness and giddiness: Secondary | ICD-10-CM | POA: Diagnosis not present

## 2018-03-23 DIAGNOSIS — Y939 Activity, unspecified: Secondary | ICD-10-CM | POA: Diagnosis not present

## 2018-03-23 DIAGNOSIS — R55 Syncope and collapse: Secondary | ICD-10-CM | POA: Diagnosis not present

## 2018-03-23 DIAGNOSIS — R000 Tachycardia, unspecified: Secondary | ICD-10-CM | POA: Diagnosis not present

## 2018-03-23 DIAGNOSIS — S99922A Unspecified injury of left foot, initial encounter: Secondary | ICD-10-CM | POA: Diagnosis not present

## 2018-03-23 DIAGNOSIS — M25562 Pain in left knee: Secondary | ICD-10-CM | POA: Diagnosis not present

## 2018-03-23 DIAGNOSIS — Y929 Unspecified place or not applicable: Secondary | ICD-10-CM | POA: Diagnosis not present

## 2018-03-23 DIAGNOSIS — M25572 Pain in left ankle and joints of left foot: Secondary | ICD-10-CM | POA: Diagnosis not present

## 2018-03-23 DIAGNOSIS — S92302A Fracture of unspecified metatarsal bone(s), left foot, initial encounter for closed fracture: Secondary | ICD-10-CM | POA: Insufficient documentation

## 2018-03-23 DIAGNOSIS — I10 Essential (primary) hypertension: Secondary | ICD-10-CM | POA: Insufficient documentation

## 2018-03-23 LAB — BASIC METABOLIC PANEL
Anion gap: 8 (ref 5–15)
BUN: 8 mg/dL (ref 6–20)
CO2: 24 mmol/L (ref 22–32)
Calcium: 9.8 mg/dL (ref 8.9–10.3)
Chloride: 103 mmol/L (ref 98–111)
Creatinine, Ser: 0.89 mg/dL (ref 0.44–1.00)
Glucose, Bld: 74 mg/dL (ref 70–99)
Potassium: 3.8 mmol/L (ref 3.5–5.1)
Sodium: 135 mmol/L (ref 135–145)

## 2018-03-23 LAB — CBC WITH DIFFERENTIAL/PLATELET
Abs Immature Granulocytes: 0.02 10*3/uL (ref 0.00–0.07)
BASOS ABS: 0 10*3/uL (ref 0.0–0.1)
BASOS PCT: 0 %
Eosinophils Absolute: 0 10*3/uL (ref 0.0–0.5)
Eosinophils Relative: 1 %
HCT: 43.9 % (ref 36.0–46.0)
Hemoglobin: 13.2 g/dL (ref 12.0–15.0)
Immature Granulocytes: 0 %
Lymphocytes Relative: 24 %
Lymphs Abs: 1.3 10*3/uL (ref 0.7–4.0)
MCH: 26.5 pg (ref 26.0–34.0)
MCHC: 30.1 g/dL (ref 30.0–36.0)
MCV: 88.2 fL (ref 80.0–100.0)
Monocytes Absolute: 0.7 10*3/uL (ref 0.1–1.0)
Monocytes Relative: 13 %
Neutro Abs: 3.4 10*3/uL (ref 1.7–7.7)
Neutrophils Relative %: 62 %
Platelets: 406 10*3/uL — ABNORMAL HIGH (ref 150–400)
RBC: 4.98 MIL/uL (ref 3.87–5.11)
RDW: 13.2 % (ref 11.5–15.5)
WBC: 5.5 10*3/uL (ref 4.0–10.5)
nRBC: 0 % (ref 0.0–0.2)

## 2018-03-23 LAB — D-DIMER, QUANTITATIVE: D-Dimer, Quant: 3.95 ug/mL-FEU — ABNORMAL HIGH (ref 0.00–0.50)

## 2018-03-23 LAB — CBG MONITORING, ED
GLUCOSE-CAPILLARY: 115 mg/dL — AB (ref 70–99)
Glucose-Capillary: 68 mg/dL — ABNORMAL LOW (ref 70–99)

## 2018-03-23 MED ORDER — ACETAMINOPHEN 325 MG PO TABS
650.0000 mg | ORAL_TABLET | Freq: Once | ORAL | Status: AC
  Administered 2018-03-23: 650 mg via ORAL
  Filled 2018-03-23: qty 2

## 2018-03-23 MED ORDER — IOHEXOL 350 MG/ML SOLN
80.0000 mL | Freq: Once | INTRAVENOUS | Status: AC | PRN
  Administered 2018-03-23: 80 mL via INTRAVENOUS

## 2018-03-23 NOTE — ED Provider Notes (Signed)
[facility] EMERGENCY DEPARTMENT Provider Note   CSN: [id number] Arrival date & time: 03/23/18  1045    History   Chief Complaint Chief Complaint  Patient presents with   Foot Pain   Loss of Consciousness    HPI Carol Hernandez is a 48 y.o. female with a PMH of HTN and eczema presenting with a syncopal episode 3 days ago. Patient states syncopal episode while at work. Patient reports she is unsure how long the episode lasted or if she hit her head. Patient denies a personal or family history of cardiac problems. Patient reports intermittent lightheadedness. Patient denies headaches, chest pain, shortness of breath, nausea, vomiting, or abdominal pain. Patient denies fever, neck pain, chills, cough, or congestion. Patient reports she twisted her left ankle when she fell. Patient reports left ankle pain since fall. Patient describes pain as throbbing and states it is worse with movement. Patient states she is not able to ambulate due to left ankle pain. Patient denies taking any medications or applying ice. Patient states pain radiates up to left knee. Patient reports ankle and foot edema. Patient denies numbness, paresthesias, or weakness.      HPI  Past Medical History:  Diagnosis Date   Eczema     There are no active problems to display for this patient.   History reviewed. No pertinent surgical history.   OB History   No obstetric history on file.      Home Medications    Prior to Admission medications   Medication Sig Start Date End Date Taking? Authorizing Provider  amLODipine (NORVASC) 5 MG tablet Take 1 tablet (5 mg total) by mouth daily. 09/10/15   Ward, Chase Picket, PA-C  clobetasol ointment (TEMOVATE) 0.05 % Apply 1 application topically 2 (two) times daily. 09/10/15   Ward, Chase Picket, PA-C  HYDROcodone-acetaminophen (NORCO/VICODIN) 5-325 MG tablet Take 1 tablet by mouth every 6 (six) hours as needed. 04/20/15   Roxy Horseman, PA-C     Family History No family history on file.  Social History Social History   Tobacco Use   Smoking status: Never Smoker   Smokeless tobacco: Never Used  Substance Use Topics   Alcohol use: No   Drug use: No     Allergies   Ibuprofen   Review of Systems Review of Systems  Constitutional: Negative for activity change, appetite change, chills, diaphoresis, fatigue, fever and unexpected weight change.  HENT: Negative for congestion and rhinorrhea.   Eyes: Negative for visual disturbance.  Respiratory: Negative for cough and shortness of breath.   Cardiovascular: Negative for chest pain, palpitations and leg swelling.  Gastrointestinal: Negative for abdominal pain, blood in stool, diarrhea, nausea and vomiting.  Genitourinary: Negative for dysuria.  Musculoskeletal: Positive for arthralgias, gait problem and joint swelling.  Skin: Negative for pallor and wound.  Allergic/Immunologic: Negative for immunocompromised state.  Neurological: Positive for dizziness, syncope and light-headedness. Negative for seizures, speech difficulty, weakness, numbness and headaches.  Hematological: Does not bruise/bleed easily.  Psychiatric/Behavioral: Negative for sleep disturbance. The patient is not nervous/anxious.    Physical Exam Updated Vital Signs BP 132/81 (BP Location: Left Arm)    Pulse (!) 103    Temp 98.2 F (36.8 C) (Oral)    Resp 18    Ht  (1.575 m)    SpO2 100%    BMI 42.43 kg/m   Physical Exam Vitals signs and nursing note reviewed.  Constitutional:      General: She is not in  acute distress.    Appearance: She is well-developed. She is not diaphoretic.  HENT:     Head: Normocephalic and atraumatic.  Cardiovascular:     Rate and Rhythm: Normal rate and regular rhythm.     Heart sounds: Normal heart sounds. No murmur. No friction rub. No gallop.   Pulmonary:     Effort: Pulmonary effort is normal. No respiratory distress.     Breath sounds: Normal breath  sounds. No wheezing or rales.  Abdominal:     Palpations: Abdomen is soft.     Tenderness: There is no abdominal tenderness.  Musculoskeletal:     Left hip: Normal. She exhibits normal range of motion, normal strength and no tenderness.     Right knee: Normal. She exhibits normal range of motion, no swelling, no effusion and no ecchymosis. No tenderness found.     Left knee: She exhibits bony tenderness. She exhibits normal range of motion, no swelling, no erythema, normal alignment, no LCL laxity, normal meniscus and no MCL laxity. Tenderness found. No medial joint line, no lateral joint line, no MCL, no LCL and no patellar tendon tenderness noted.     Right ankle: Normal. She exhibits normal range of motion, no swelling, no ecchymosis and no deformity.     Left ankle: She exhibits decreased range of motion and swelling. She exhibits no ecchymosis, no deformity, no laceration and normal pulse. Tenderness. Lateral malleolus and medial malleolus tenderness found. No AITFL, no CF ligament, no posterior TFL, no head of 5th metatarsal and no proximal fibula tenderness found. Achilles tendon exhibits no pain.     Right foot: Normal. Normal range of motion. No tenderness, bony tenderness or swelling.     Left foot: Decreased range of motion. Tenderness, bony tenderness and swelling present.     Comments: Mild edema noted on dorsal aspect of left foot and lateral left ankle. No edema or skin abnormality noted on left knee. No bruising or erythema noted on left ankle or foot.Tenderness to palpation of anterior knee. Negative anterior/posterior drawer test. Negative valgus/varus stress test. Diffuse tenderness to palpation of left ankle and foot. Decreased ROM and strength due to pain. Sensation intact. 2+ DP pulses.   Skin:    General: Skin is warm.     Findings: No erythema or rash.  Neurological:     Mental Status: She is alert and oriented to person, place, and time.    Mental Status:  Alert,  oriented, thought content appropriate, able to give a coherent history. Speech fluent without evidence of aphasia. Able to follow 2 step commands without difficulty.  Cranial Nerves:  II:  Peripheral visual fields grossly normal, pupils equal, round, reactive to light III,IV, VI: ptosis not present, extra-ocular motions intact bilaterally  V,VII: smile symmetric, facial light touch sensation equal VIII: hearing grossly normal to voice  X: uvula elevates symmetrically  XI: bilateral shoulder shrug symmetric and strong XII: midline tongue extension without fassiculations Motor:  Normal tone. 5/5 in upper extremities and right lower extremity including strong and equal grip strength and dorsiflexion/plantar flexion. 4/5 strength in left lower extremity due to pain.  Sensory:  light touch normal in all extremities.  Deep Tendon Reflexes: 2+ and symmetric in the biceps and patella Cerebellar: normal finger-to-nose with bilateral upper extremities Gait: patient is not able to ambulate due to left ankle/foot pain. CV: distal pulses palpable throughout   ED Treatments / Results  Labs (all labs ordered are listed, but only abnormal results are displayed)  Labs Reviewed  CBC WITH DIFFERENTIAL/PLATELET - Abnormal; Notable for the following components:      Result Value   Platelets 406 (*)    All other components within normal limits  D-DIMER, QUANTITATIVE (NOT AT [facility]) - Abnormal; Notable for the following components:   D-Dimer, Quant 3.95 (*)    All other components within normal limits  CBG MONITORING, ED - Abnormal; Notable for the following components:   Glucose-Capillary 68 (*)    All other components within normal limits  CBG MONITORING, ED - Abnormal; Notable for the following components:   Glucose-Capillary 115 (*)    All other components within normal limits  BASIC METABOLIC PANEL    EKG EKG Interpretation  Date/Time:  Saturday March 23 2018 10:56:44 EDT Ventricular Rate:   114 PR Interval:  156 QRS Duration: 84 QT Interval:  324 QTC Calculation: 446 R Axis:   43 Text Interpretation:  Sinus tachycardia Otherwise normal ECG agree, no old Confirmed by Arby Barrette [phone]) on 03/23/2018 1:25:48 PM   Radiology Dg Ankle Complete Left  Result Date: 03/23/2018 CLINICAL DATA:  Patient with leg pain since Thursday. EXAM: LEFT ANKLE COMPLETE - 3+ VIEW COMPARISON:  None. FINDINGS: Normal anatomic alignment. No evidence for acute fracture or dislocation. Talar dome is intact. IMPRESSION: No acute fracture. Electronically Signed   By: Annia Belt M.D.   On: 03/23/2018 12:32   Ct Angio Chest Pe W/cm &/or Wo Cm  Result Date: 03/23/2018 CLINICAL DATA:  Syncope with elevated D-dimer EXAM: CT ANGIOGRAPHY CHEST WITH CONTRAST TECHNIQUE: Multidetector CT imaging of the chest was performed using the standard protocol during bolus administration of intravenous contrast. Multiplanar CT image reconstructions and MIPs were obtained to evaluate the vascular anatomy. CONTRAST:  80mL OMNIPAQUE IOHEXOL 350 MG/ML SOLN COMPARISON:  None. FINDINGS: Cardiovascular: There is no demonstrable pulmonary embolus. There is no thoracic aortic aneurysm or dissection. The visualized great vessels appear unremarkable. There is no pericardial effusion or pericardial thickening. There are occasional foci of coronary artery calcification. There is left ventricular hypertrophy. Mediastinum/Nodes: Thyroid appears normal. There is no appreciable thoracic adenopathy. There is no appreciable esophageal lesion. Lungs/Pleura: There is no edema or consolidation. Areas of slight lower lobe atelectatic change noted. There is no pleural effusion or pleural thickening evident. Upper Abdomen: Visualized upper abdominal structures appear normal. Note that there is slight elevation of the left hemidiaphragm. Musculoskeletal: There are no blastic or lytic bone lesions. No chest wall lesions are evident. Review of the MIP images  confirms the above findings. IMPRESSION: 1. No demonstrable pulmonary embolus. No thoracic aortic aneurysm or dissection. 2. Occasional foci of coronary artery calcification noted. There is left ventricular hypertrophy. 3.  Slight bibasilar atelectasis.  No edema or consolidation. 4.  No appreciable thoracic adenopathy. Electronically Signed   By: Bretta Bang III M.D.   On: 03/23/2018 15:18   Dg Knee Complete 4 Views Left  Result Date: 03/23/2018 CLINICAL DATA:  Left knee pain status post fall. EXAM: LEFT KNEE - COMPLETE 4+ VIEW COMPARISON:  None. FINDINGS: Normal anatomic alignment. No evidence for acute fracture or dislocation. Regional soft tissues are unremarkable. IMPRESSION: No acute osseous abnormality. Electronically Signed   By: Annia Belt M.D.   On: 03/23/2018 12:24   Dg Foot Complete Left  Result Date: 03/23/2018 CLINICAL DATA:  Patient status post fall.  Pain.  Initial encounter. EXAM: LEFT FOOT - COMPLETE 3+ VIEW COMPARISON:  None. FINDINGS: There is cortical irregularity of the distal second, third and fourth  metatarsal diaphysis concerning for oblique fractures. Possibility of angulated fracture involving the fifth metatarsal head not excluded. Midfoot degenerative changes. No evidence for associated acute fractures. Regional soft tissues are unremarkable. IMPRESSION: Findings suggestive of oblique fractures through the distal aspect of the second, third and fourth metatarsals. Possible fifth metatarsal head fracture. Electronically Signed   By: Annia Belt M.D.   On: 03/23/2018 12:30    Procedures Procedures (including critical care time)  Medications Ordered in ED Medications  acetaminophen (TYLENOL) tablet 650 mg (650 mg Oral Given 03/23/18 1214)  iohexol (OMNIPAQUE) 350 MG/ML injection 80 mL (80 mLs Intravenous Contrast Given 03/23/18 1457)     Initial Impression / Assessment and Plan / ED Course  I have reviewed the triage vital signs and the nursing notes.  Pertinent  labs & imaging results that were available during my care of the patient were reviewed by me and considered in my medical decision making (see chart for details).  Clinical Course as of Mar 22 1524  Sat Mar 23, 2018  1142 Hypoglycemia noted at 75. Will provide orange juice.  Glucose-Capillary(!): 68 [AH]  1218 Blood glucose improved to 115 with orange juice.   Glucose-Capillary(!): 115 [AH]  1227 No acute osseous abnormality noted on left knee x ray.   DG Knee Complete 4 Views Left [AH]  1259 X ray reveals findings suggestive of oblique fractures through the distal aspect of the second, third and fourth metatarsals. Possible fifth metatarsal head fracture.    DG Foot Complete Left [AH]  1259 No acute fracture noted on ankle x ray.  DG Ankle Complete Left [AH]  1522 No demonstrable pulmonary embolus. No thoracic aortic aneurysm or dissection noted on CTA.    CT Angio Chest PE W/Cm &/Or Wo Cm [AH]    Clinical Course User Index [AH] Leretha Dykes, PA-C      Patient presents after a syncopal episode. Neurological exam is normal. Patient without arrhythmia while here in the department.  Patient without history of congestive heart failure, normal hematocrit, EKG reveals sinus tachycardia, no shortness of breath and systolic blood pressure greater than 90. Hypoglycemia noted at 68 and provided orange juice. Blood glucose improved to 115. Hypoglycemia may have caused episode of syncope. Patient denies chest pain or shortness of breath, but concern for PE due to syncopal episode and tachycardia. D dimer was elevated. Ordered CTA. CTA is negative. Will plan for discharge home with close PCP follow-up.  Possibility of recurrent syncope has been discussed. I discussed reasons to avoid driving until PCP follow up and other safety prevention including use of ladders and working at heights.   Pt has remained hemodynamically stable throughout their time in the ED  BP 132/81 (BP Location: Left Arm)     Pulse (!) 103    Temp 98.2 F (36.8 C) (Oral)    Resp 18    Ht 5\' 2"  (1.575 m)    SpO2 100%    BMI 42.43 kg/m   Patient x-ray with oblique fractures through the distal aspect of 2nd, 3rd, and 4th metatarsals of left foot. Possible left metatarsal head fracture. Pain managed in ED. Pt advised to follow up with orthopedics for further evaluation and treatment. Patient given Cam Walker and crutches while in ED, conservative therapy recommended and discussed.  The patient was discussed with and seen by Dr. Donnald Garre who agrees with the treatment plan.  Final Clinical Impressions(s) / ED Diagnoses   Final diagnoses:  Syncope and collapse  Left  foot pain  Closed nondisplaced fracture of metatarsal bone of left foot, unspecified metatarsal, initial encounter    ED Discharge Orders    None       Leretha Dykes, [place] 03/23/18 1527    Arby Barrette, MD 03/24/18 1212

## 2018-03-23 NOTE — Discharge Instructions (Addendum)
You have been seen today for loss of consciousness and left foot pain. Please read and follow all provided instructions.   1. Medications: tylenol/ibuprofen for pain, usual home medications 2. Treatment: rest, drink plenty of fluids, wear boot, do not apply pressure and use crutches 3. Follow Up: Please call and follow up with orthopedics due to fractures in foot. Please follow up with your primary doctor in 2 days for discussion of your diagnoses and further evaluation after today's visit; if you do not have a primary care doctor use the resource guide provided to find one; Please return to the ER for any new or worsening symptoms. Please obtain all of your results from medical records or have your doctors office obtain the results - share them with your doctor - you should be seen at your doctors office. Call today to arrange your follow up.   Take medications as prescribed. Please review all of the medicines and only take them if you do not have an allergy to them. Return to the emergency room for worsening condition or new concerning symptoms. Follow up with your regular doctor. If you don't have a regular doctor use one of the numbers below to establish a primary care doctor.  Please be aware that if you are taking birth control pills, taking other prescriptions, ESPECIALLY ANTIBIOTICS may make the birth control ineffective - if this is the case, either do not engage in sexual activity or use alternative methods of birth control such as condoms until you have finished the medicine and your family doctor says it is OK to restart them. If you are on a blood thinner such as COUMADIN, be aware that any other medicine that you take may cause the coumadin to either work too much, or not enough - you should have your coumadin level rechecked in next 7 days if this is the case.  ?  It is also a possibility that you have an allergic reaction to any of the medicines that you have been prescribed - Everybody  reacts differently to medications and while MOST people have no trouble with most medicines, you may have a reaction such as nausea, vomiting, rash, swelling, shortness of breath. If this is the case, please stop taking the medicine immediately and contact your physician.  ?  You should return to the ER if you develop severe or worsening symptoms.   Emergency Department Resource Guide 1) Find a Doctor and Pay Out of Pocket Although you won't have to find out who is covered by your insurance plan, it is a good idea to ask around and get recommendations. You will then need to call the office and see if the doctor you have chosen will accept you as a new patient and what types of options they offer for patients who are self-pay. Some doctors offer discounts or will set up payment plans for their patients who do not have insurance, but you will need to ask so you aren't surprised when you get to your appointment.  2) Contact Your Local Health Department Not all health departments have doctors that can see patients for sick visits, but many do, so it is worth a call to see if yours does. If you don't know where your local health department is, you can check in your phone book. The CDC also has a tool to help you locate your state's health department, and many state websites also have listings of all of their local health departments.  3) Find a  Walk-in Clinic If your illness is not likely to be very severe or complicated, you may want to try a walk in clinic. These are popping up all over the country in pharmacies, drugstores, and shopping centers. They're usually staffed by nurse practitioners or physician assistants that have been trained to treat common illnesses and complaints. They're usually fairly quick and inexpensive. However, if you have serious medical issues or chronic medical problems, these are probably not your best option.  No Primary Care Doctor: Call [facility] at  [phone] - they can  help you locate a primary care doctor that  accepts your insurance, provides certain services, etc. Physician Referral [phone]  Emergency Department Resource Guide 1) Find a Doctor and Pay Out of Pocket Although you won't have to find out who is covered by your insurance plan, it is a good idea to ask around and get recommendations. You will then need to call the office and see if the doctor you have chosen will accept you as a new patient and what types of options they offer for patients who are self-pay. Some doctors offer discounts or will set up payment plans for their patients who do not have insurance, but you will need to ask so you aren't surprised when you get to your appointment.  2) Contact Your Local Health Department Not all health departments have doctors that can see patients for sick visits, but many do, so it is worth a call to see if yours does. If you don't know where your local health department is, you can check in your phone book. The CDC also has a tool to help you locate your state's health department, and many state websites also have listings of all of their local health departments.  3) Find a Walk-in Clinic If your illness is not likely to be very severe or complicated, you may want to try a walk in clinic. These are popping up all over the country in pharmacies, drugstores, and shopping centers. They're usually staffed by nurse practitioners or physician assistants that have been trained to treat common illnesses and complaints. They're usually fairly quick and inexpensive. However, if you have serious medical issues or chronic medical problems, these are probably not your best option.  No Primary Care Doctor: Call [facility] Connect at  [phone] - they can help you locate a primary care doctor that  accepts your insurance, provides certain services, etc. Physician Referral Service- [phone]  Chronic Pain Problems: Organization         Address  Phone    Notes  [facility]  [phone] Patients need to be referred by their primary care doctor.   Medication Assistance: Organization         Address  Phone   Notes  [facility] Medication [facility] [address] [place] [place], [place] [address] [phone] --Must be a resident of [facility] -- Must have NO insurance coverage whatsoever (no Medicaid/ Medicare, etc.) -- The pt. MUST have a primary care doctor that directs their care regularly and follows them in the community   [facility]  [phone]   [employer]  [phone]    Agencies that provide inexpensive medical care: Organization         Address  Phone   Notes  [facility]  [phone]   [facility]    [phone]   [facility] [address] [place],  [place] [address] [phone]   [facility] [address] [place] [phone]   [facility]    [phone]   [facility]    [phone]   [facility]  [address], [place] Phone:  [phone], Fax:  [phone] Hours of Operation:  9 am - 6 pm, M-F.  Also accepts Medicaid/Medicare and self-pay.  [facility]  [address], [place] Phone: [phone], Fax: [phone]. Hours of Operation:  8:30 am - 5:30 pm, M-F.  Also accepts Medicaid and self-pay.  [facility] [place] [address], [place] Phone: [phone]   [facility] [address] Natasha Bence [place], [place] [phone], Ext. [phone] Mondays & Thursdays: 7-9 AM.  First 15 patients are seen on a first come, first serve basis.    Medicaid-accepting [facility] Providers:  Organization         Address  Phone   Notes  [facility] [address], [place] [phone] Also accepts self-pay patients.  [facility]  [address] Laurell Josephs [place], [place], [place]  [phone]   [facility] [address], [place] [phone]   [facility] [address], [place] [phone]   Renaye Rakers [address], [place]   [phone] Only accepts [place] patients after they have their name applied to their card.   Self-Pay (no insurance) in [facility]:  Organization         Address  Phone   Notes  Sickle Cell Patients, [facility] [address] [place], [place] [phone]   [facility] [address] [place], [place] [phone]   [facility] [place]  [address], [place] [phone]   [facility]/Dr. Osei-Bonsu  [address], [place] or [address], [place] [phone] Phone number for both [place] and [place] locations is the same.  [facility] [address] [place] [phone]   [facility] [address], [place] or [address] [phone]   [facility] [address] [place] [phone], phone; [phone], fax Sees patients 1st and 3rd Saturday of every month.  Must not qualify for public or private insurance (i.e. Medicaid, Medicare, [place] [employer] Choice, Veterans' Benefits)  Household income should be no more than 200% of the poverty level The clinic cannot treat you if you are pregnant or think you are pregnant  Sexually transmitted diseases are not treated at the clinic.

## 2018-03-23 NOTE — ED Notes (Signed)
Pt away from room at imaging, will obtain labs, cbg, and vs upon return

## 2018-03-23 NOTE — Progress Notes (Signed)
Orthopedic Tech Progress Note Patient Details:  Carol Hernandez 06/27/1970 [id number]  Ortho Devices Type of Ortho Device: Crutches, CAM walker Ortho Device/Splint Location: LLE Ortho Device/Splint Interventions: Adjustment, Application, Ordered   Post Interventions Patient Tolerated: Well, Ambulated well Instructions Provided: Poper ambulation with device, Care of device, Adjustment of device   Donald Pore 03/23/2018, 1:44 PM

## 2018-03-23 NOTE — ED Notes (Signed)
Patient transported to CT 

## 2018-03-23 NOTE — ED Triage Notes (Signed)
Pt. Stated, Carol Hernandez had left foot pain since  Thursday not sure why. I did fall out on Thursday. And Im not sure if my foot was involved. Today it hurts and is swollen (left)

## 2018-03-23 NOTE — ED Notes (Signed)
Pt returned from CT °

## 2019-12-12 DIAGNOSIS — Z634 Disappearance and death of family member: Secondary | ICD-10-CM | POA: Diagnosis not present

## 2020-04-04 IMAGING — DX LEFT KNEE - COMPLETE 4+ VIEW
4 series · 4 of 4 positions shown · non-contrast
Comparison: None.

CLINICAL DATA: Left knee pain status post fall.

EXAM:
LEFT KNEE - COMPLETE 4+ VIEW

[knee ap]
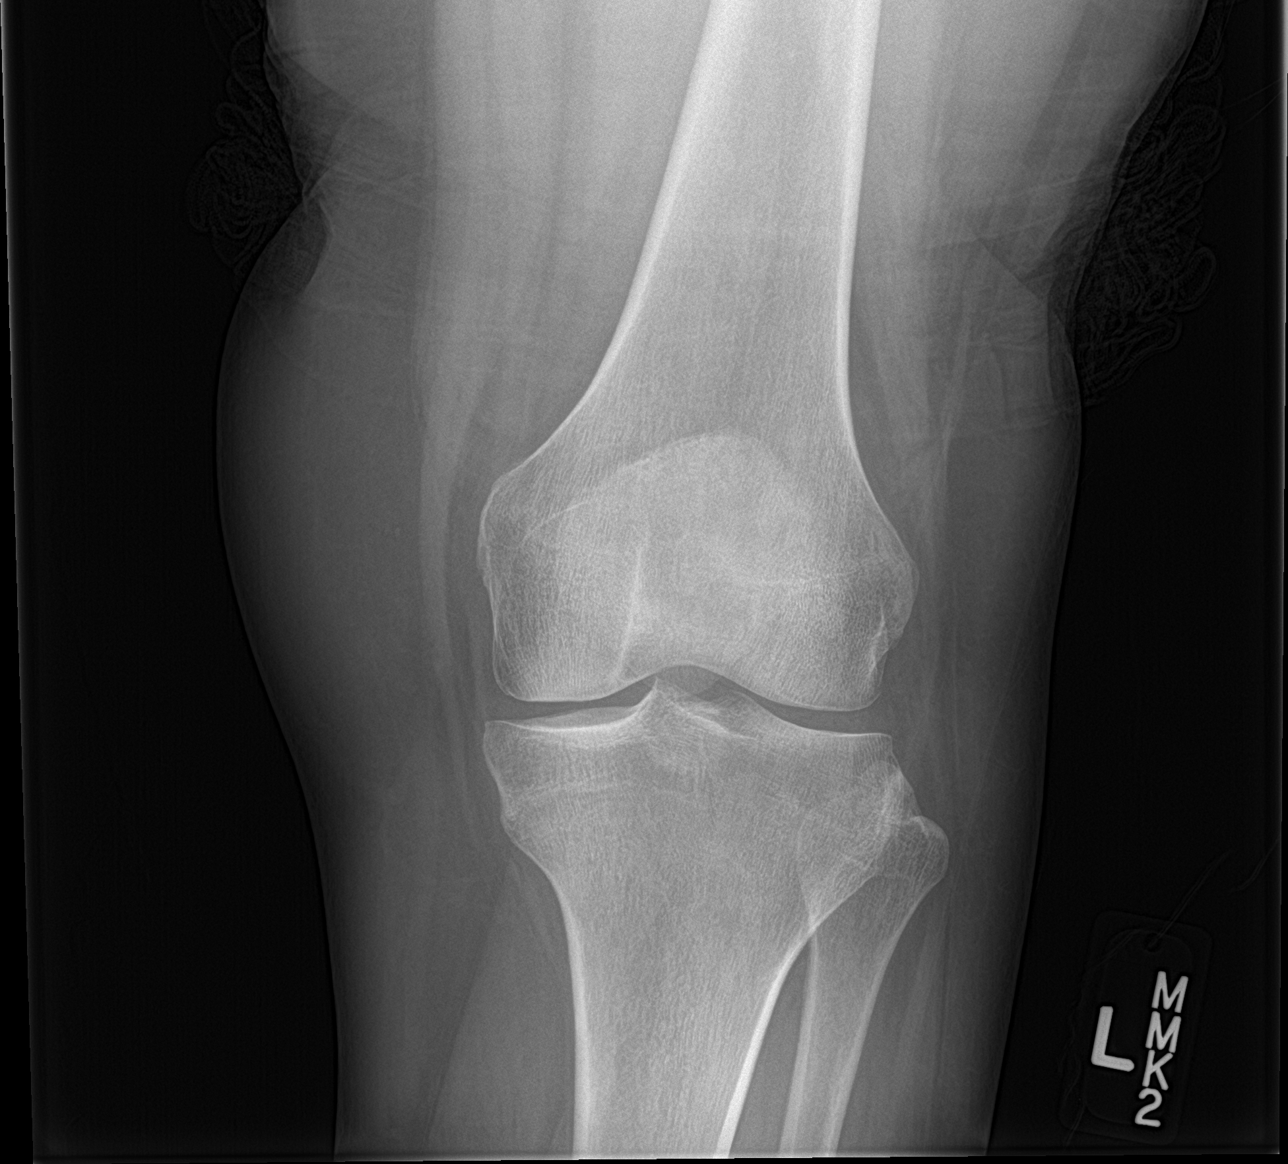

[knee lat]
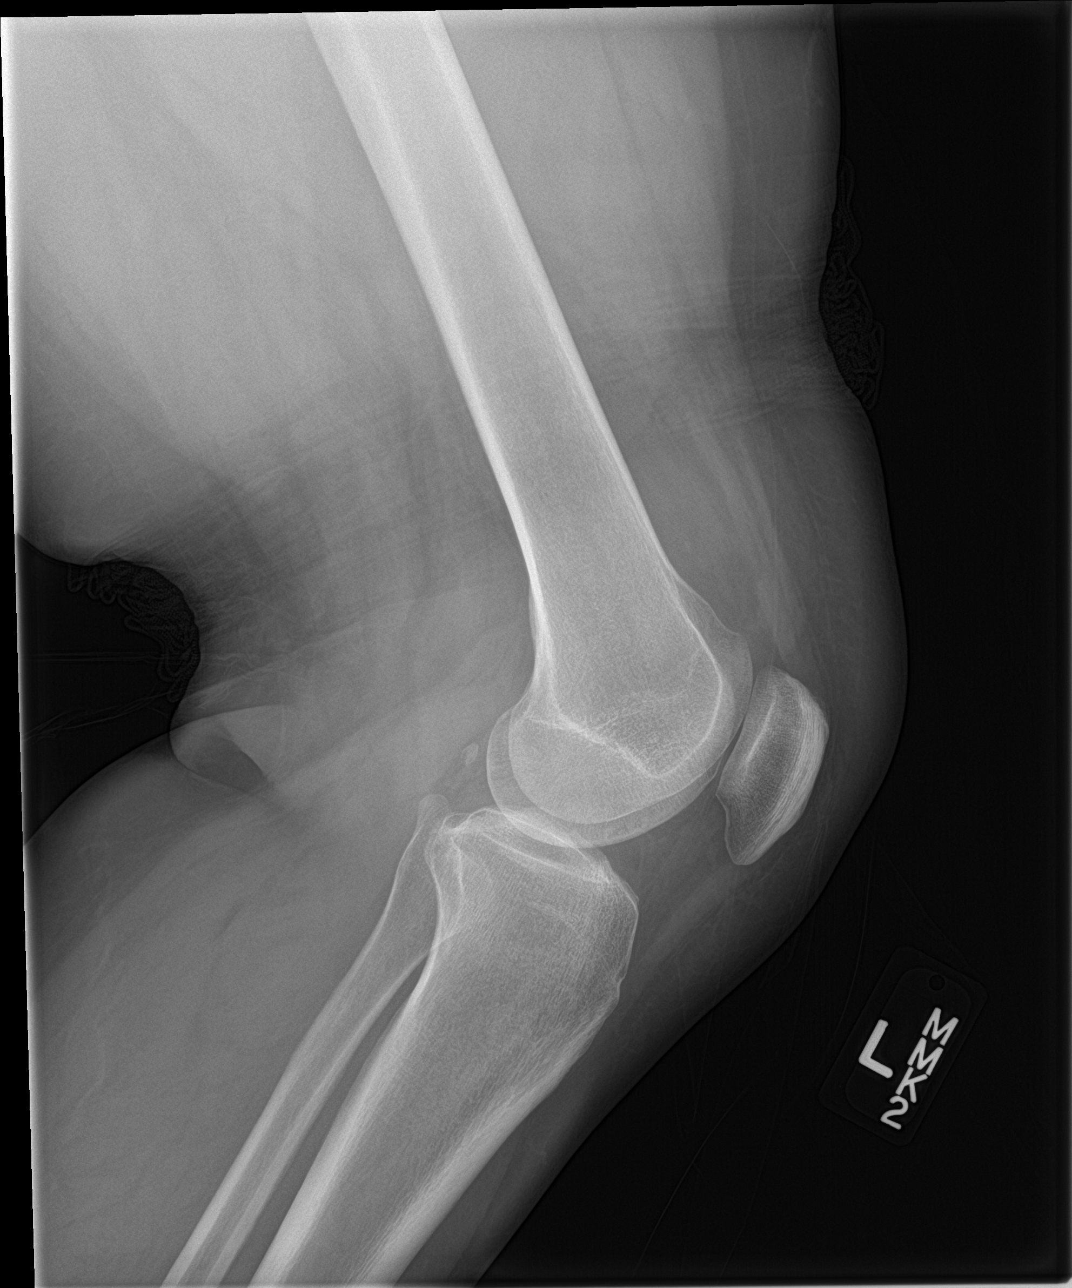

[knee obl (1 of 2)]
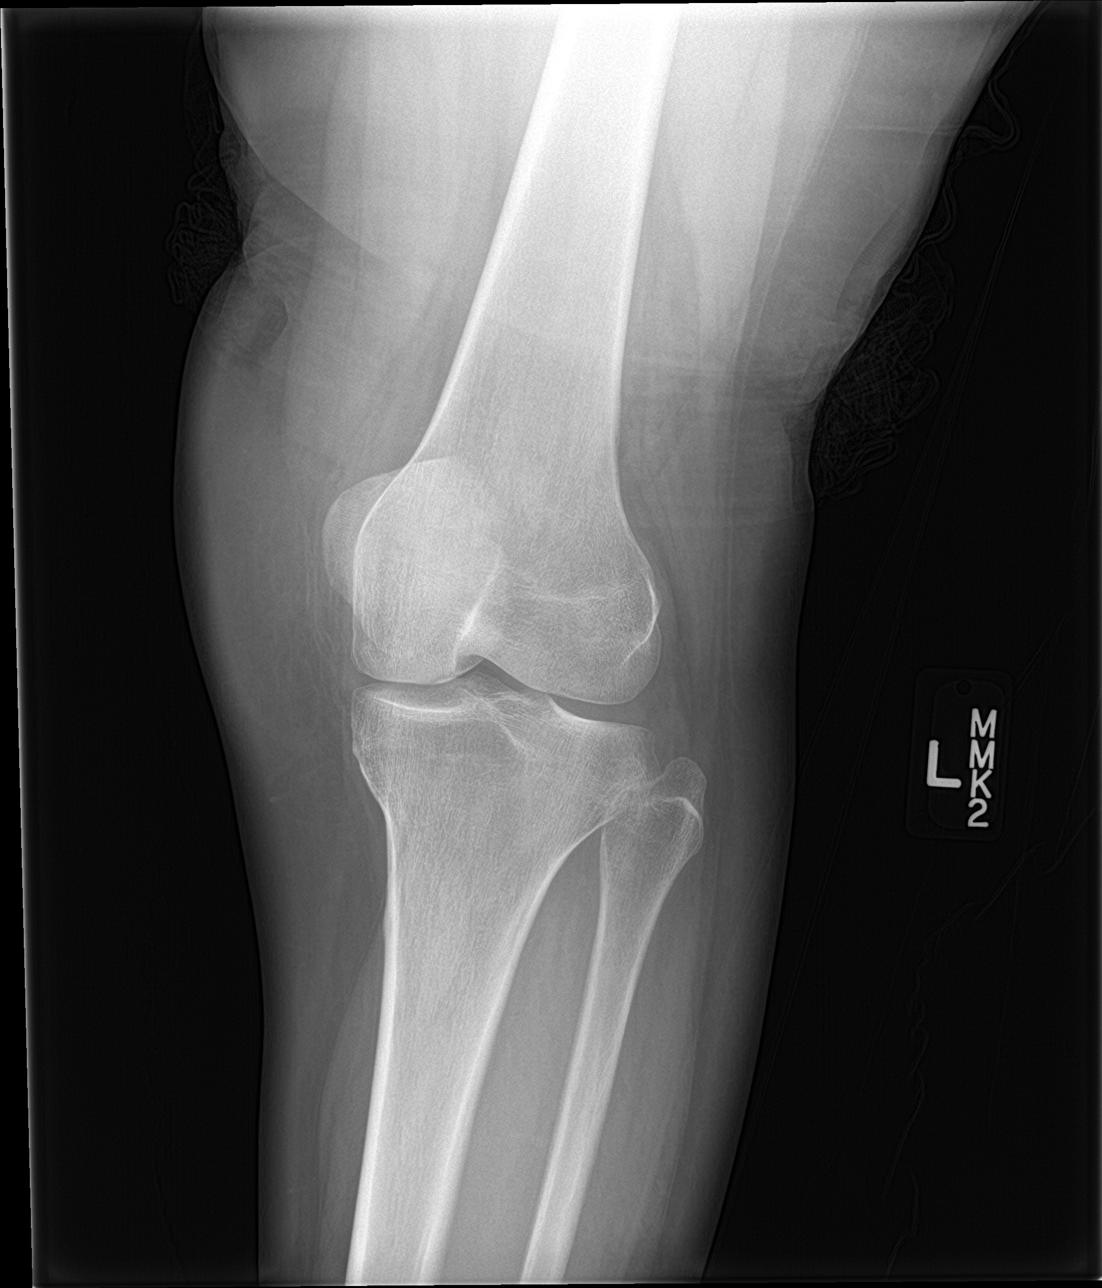

[knee obl (2 of 2)]
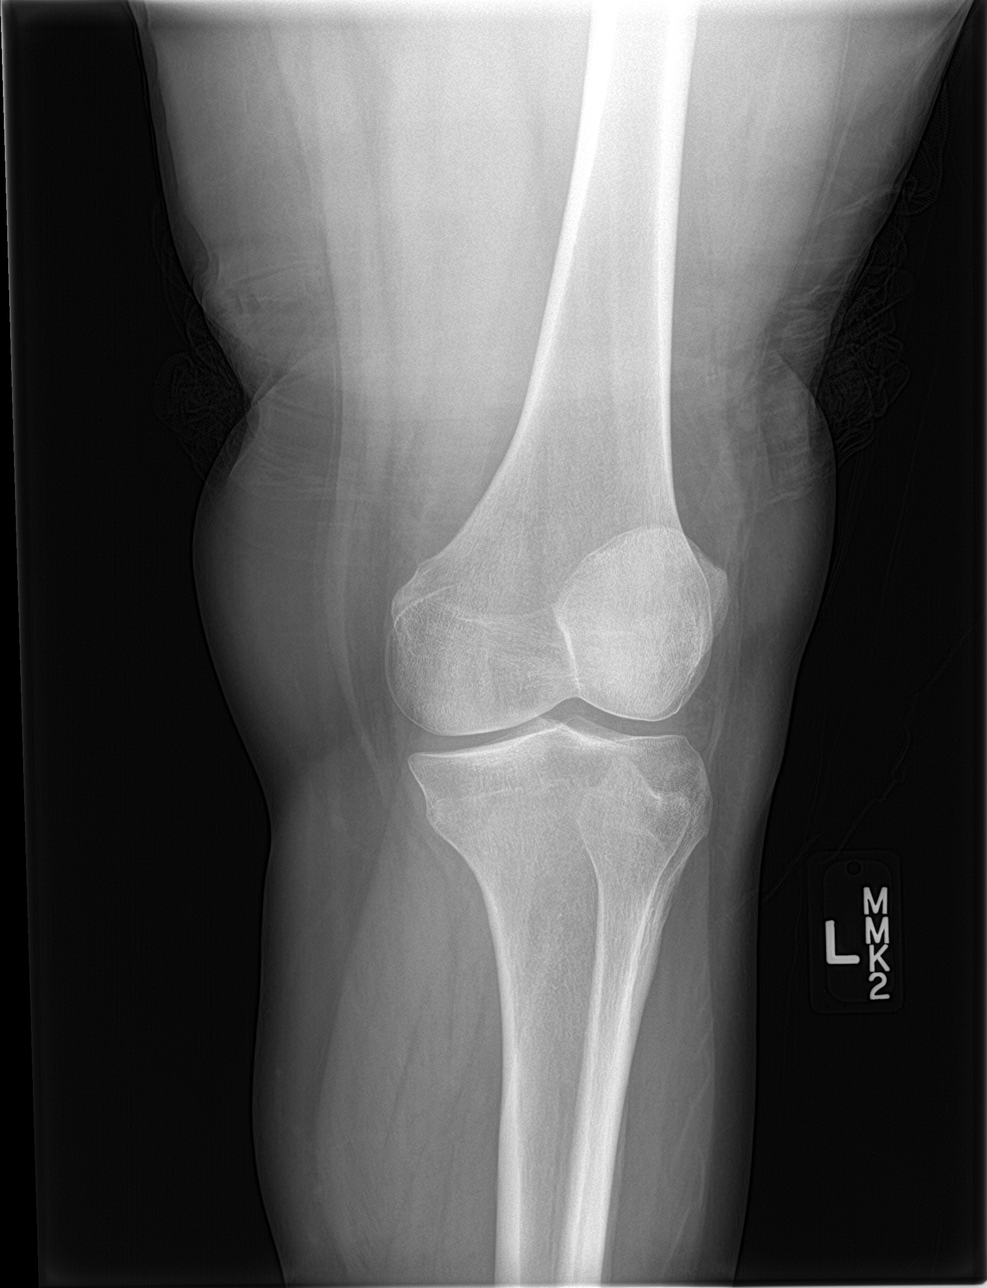

[4 of 4 positions shown; findings below may reference images not displayed]

FINDINGS: Normal anatomic alignment. No evidence for acute fracture or
dislocation. Regional soft tissues are unremarkable.
IMPRESSION: No acute osseous abnormality.

## 2020-04-04 IMAGING — DX LEFT ANKLE COMPLETE - 3+ VIEW
3 series · 3 of 3 positions shown · non-contrast
Comparison: None.

CLINICAL DATA: Patient with leg pain since [REDACTED].

EXAM:
LEFT ANKLE COMPLETE - 3+ VIEW

[ankle ap]
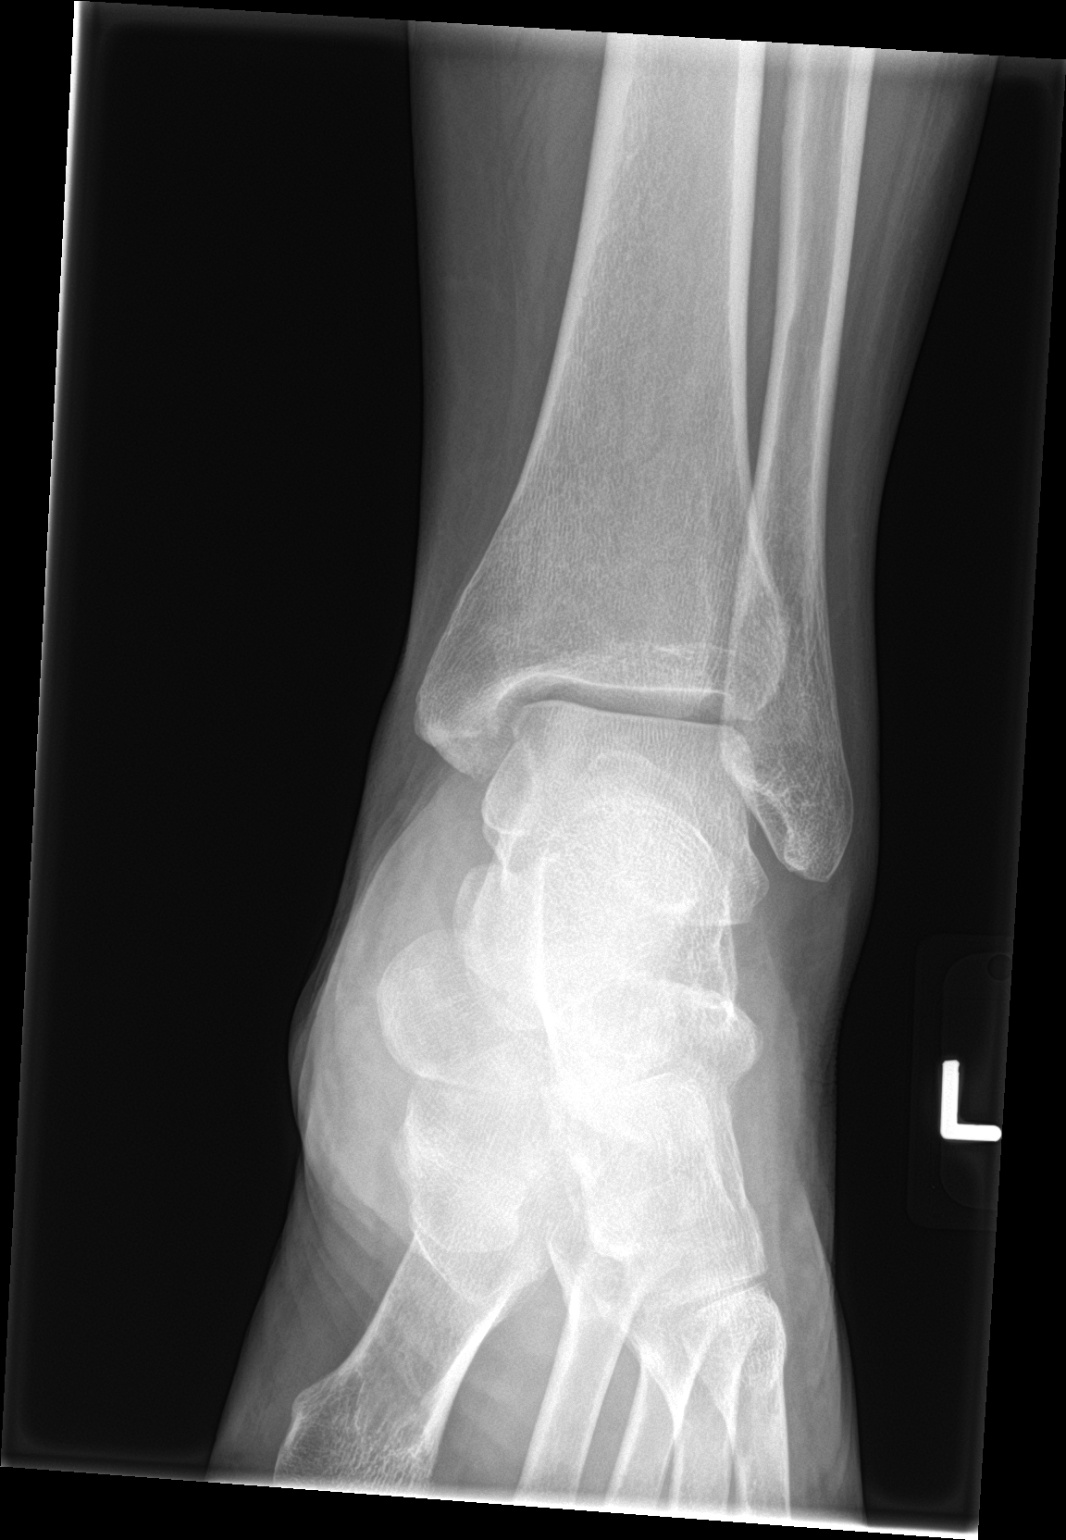

[ankle obl]
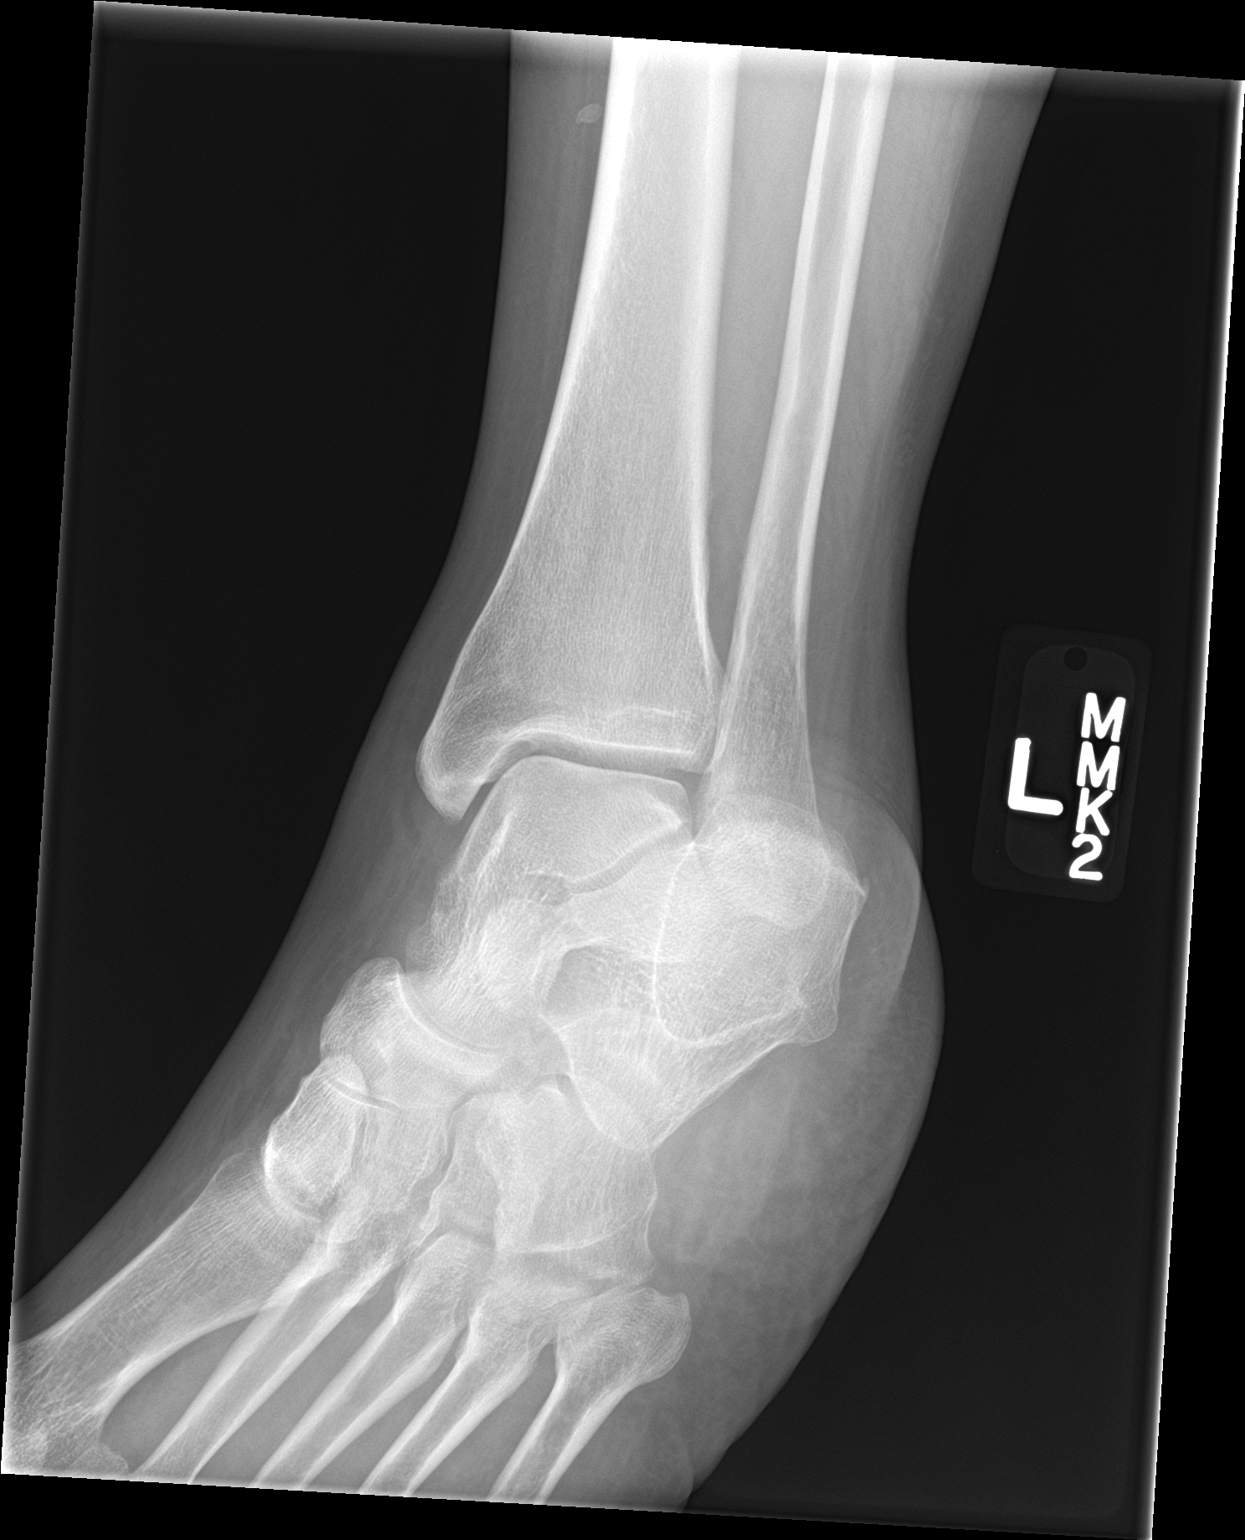

[ankle lat]
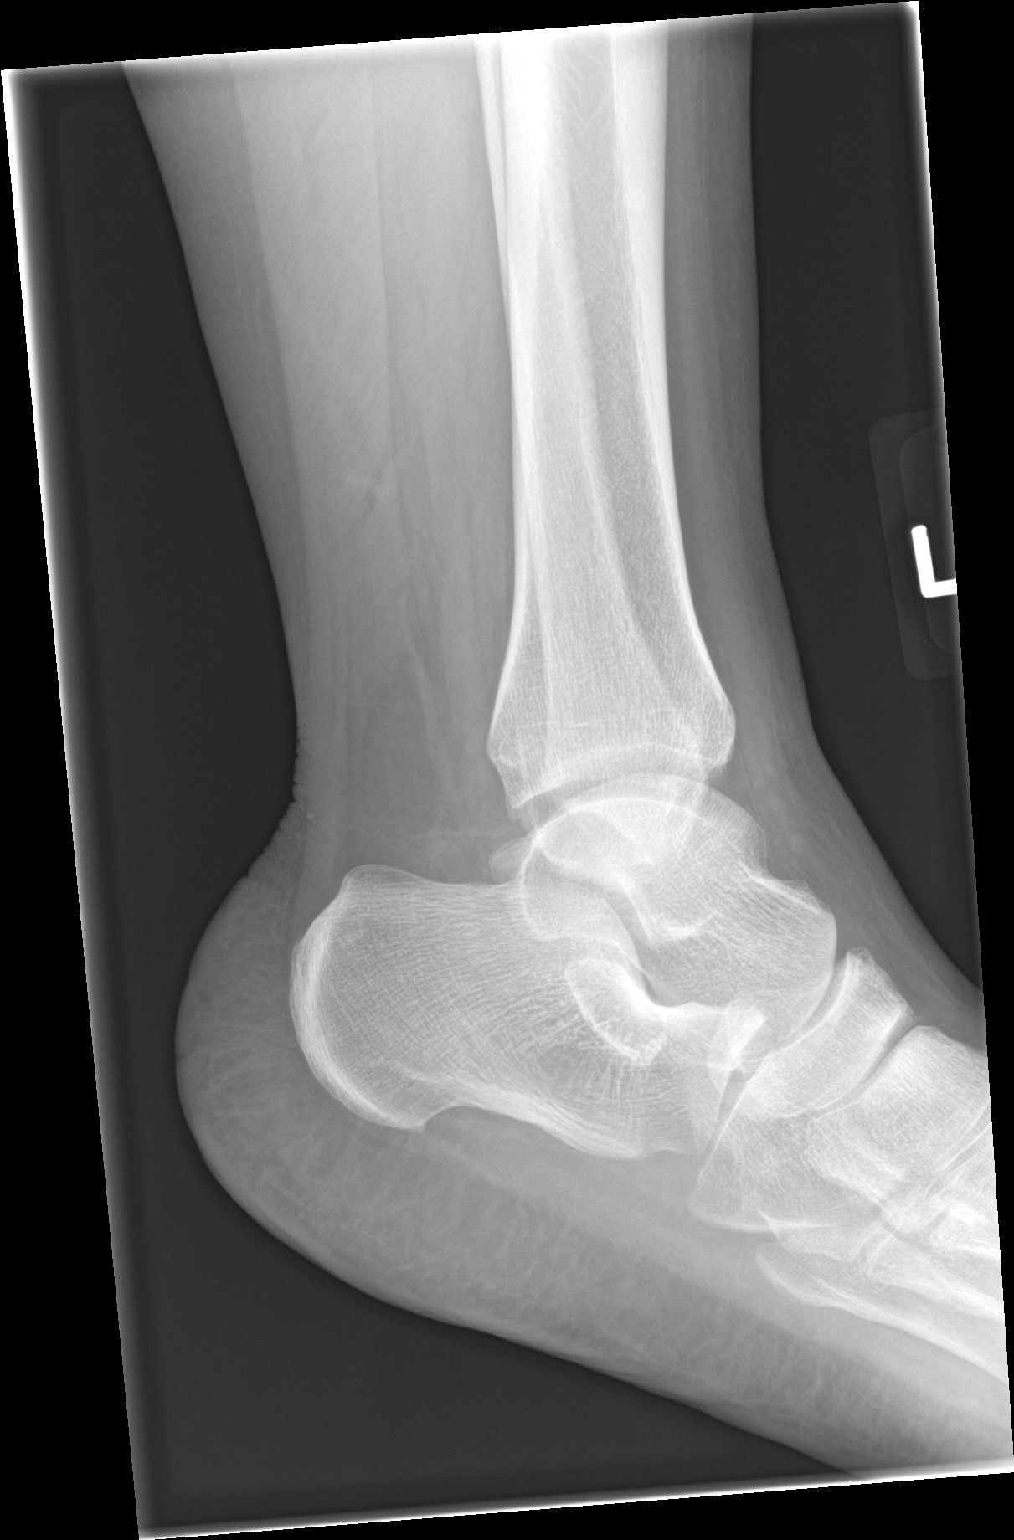

[3 of 3 positions shown; findings below may reference images not displayed]

FINDINGS: Normal anatomic alignment. No evidence for acute fracture or
dislocation. Talar dome is intact.
IMPRESSION: No acute fracture.

## 2020-04-30 DIAGNOSIS — Z1152 Encounter for screening for COVID-19: Secondary | ICD-10-CM | POA: Diagnosis not present

## 2020-05-07 DIAGNOSIS — Z1152 Encounter for screening for COVID-19: Secondary | ICD-10-CM | POA: Diagnosis not present

## 2020-05-14 DIAGNOSIS — Z1152 Encounter for screening for COVID-19: Secondary | ICD-10-CM | POA: Diagnosis not present

## 2020-05-31 DIAGNOSIS — Z1152 Encounter for screening for COVID-19: Secondary | ICD-10-CM | POA: Diagnosis not present

## 2020-06-02 DIAGNOSIS — Z1152 Encounter for screening for COVID-19: Secondary | ICD-10-CM | POA: Diagnosis not present

## 2020-06-11 DIAGNOSIS — Z1152 Encounter for screening for COVID-19: Secondary | ICD-10-CM | POA: Diagnosis not present

## 2020-07-03 DIAGNOSIS — Z1152 Encounter for screening for COVID-19: Secondary | ICD-10-CM | POA: Diagnosis not present

## 2020-07-05 DIAGNOSIS — Z1152 Encounter for screening for COVID-19: Secondary | ICD-10-CM | POA: Diagnosis not present

## 2020-07-09 DIAGNOSIS — Z1152 Encounter for screening for COVID-19: Secondary | ICD-10-CM | POA: Diagnosis not present

## 2020-07-14 DIAGNOSIS — Z1152 Encounter for screening for COVID-19: Secondary | ICD-10-CM | POA: Diagnosis not present

## 2020-07-19 DIAGNOSIS — Z1152 Encounter for screening for COVID-19: Secondary | ICD-10-CM | POA: Diagnosis not present

## 2020-07-22 DIAGNOSIS — Z1152 Encounter for screening for COVID-19: Secondary | ICD-10-CM | POA: Diagnosis not present

## 2020-07-30 DIAGNOSIS — Z1152 Encounter for screening for COVID-19: Secondary | ICD-10-CM | POA: Diagnosis not present

## 2020-07-31 DIAGNOSIS — Z1152 Encounter for screening for COVID-19: Secondary | ICD-10-CM | POA: Diagnosis not present

## 2020-08-03 DIAGNOSIS — Z1152 Encounter for screening for COVID-19: Secondary | ICD-10-CM | POA: Diagnosis not present

## 2020-08-06 DIAGNOSIS — Z1152 Encounter for screening for COVID-19: Secondary | ICD-10-CM | POA: Diagnosis not present

## 2020-08-09 DIAGNOSIS — Z1152 Encounter for screening for COVID-19: Secondary | ICD-10-CM | POA: Diagnosis not present

## 2020-08-13 DIAGNOSIS — Z1152 Encounter for screening for COVID-19: Secondary | ICD-10-CM | POA: Diagnosis not present

## 2020-08-17 DIAGNOSIS — Z1152 Encounter for screening for COVID-19: Secondary | ICD-10-CM | POA: Diagnosis not present

## 2020-08-24 DIAGNOSIS — Z1152 Encounter for screening for COVID-19: Secondary | ICD-10-CM | POA: Diagnosis not present

## 2020-08-28 DIAGNOSIS — Z1152 Encounter for screening for COVID-19: Secondary | ICD-10-CM | POA: Diagnosis not present

## 2020-08-30 DIAGNOSIS — Z1152 Encounter for screening for COVID-19: Secondary | ICD-10-CM | POA: Diagnosis not present

## 2020-09-07 DIAGNOSIS — Z1152 Encounter for screening for COVID-19: Secondary | ICD-10-CM | POA: Diagnosis not present

## 2020-09-20 DIAGNOSIS — Z1152 Encounter for screening for COVID-19: Secondary | ICD-10-CM | POA: Diagnosis not present

## 2020-09-27 DIAGNOSIS — L309 Dermatitis, unspecified: Secondary | ICD-10-CM | POA: Diagnosis not present

## 2020-09-27 DIAGNOSIS — I10 Essential (primary) hypertension: Secondary | ICD-10-CM | POA: Diagnosis not present

## 2020-09-27 DIAGNOSIS — E785 Hyperlipidemia, unspecified: Secondary | ICD-10-CM | POA: Diagnosis not present

## 2020-09-27 DIAGNOSIS — F4321 Adjustment disorder with depressed mood: Secondary | ICD-10-CM | POA: Diagnosis not present

## 2020-09-28 DIAGNOSIS — Z1152 Encounter for screening for COVID-19: Secondary | ICD-10-CM | POA: Diagnosis not present

## 2020-10-08 DIAGNOSIS — Z1152 Encounter for screening for COVID-19: Secondary | ICD-10-CM | POA: Diagnosis not present

## 2020-10-13 DIAGNOSIS — Z1152 Encounter for screening for COVID-19: Secondary | ICD-10-CM | POA: Diagnosis not present

## 2020-10-22 DIAGNOSIS — Z1152 Encounter for screening for COVID-19: Secondary | ICD-10-CM | POA: Diagnosis not present

## 2021-04-09 DIAGNOSIS — Z1152 Encounter for screening for COVID-19: Secondary | ICD-10-CM | POA: Diagnosis not present

## 2021-04-15 DIAGNOSIS — Z1152 Encounter for screening for COVID-19: Secondary | ICD-10-CM | POA: Diagnosis not present

## 2021-04-30 DIAGNOSIS — Z1152 Encounter for screening for COVID-19: Secondary | ICD-10-CM | POA: Diagnosis not present

## 2021-05-06 DIAGNOSIS — Z1152 Encounter for screening for COVID-19: Secondary | ICD-10-CM | POA: Diagnosis not present

## 2021-05-13 DIAGNOSIS — Z1152 Encounter for screening for COVID-19: Secondary | ICD-10-CM | POA: Diagnosis not present

## 2021-05-22 DIAGNOSIS — Z1152 Encounter for screening for COVID-19: Secondary | ICD-10-CM | POA: Diagnosis not present

## 2021-05-28 DIAGNOSIS — Z1152 Encounter for screening for COVID-19: Secondary | ICD-10-CM | POA: Diagnosis not present

## 2021-06-03 DIAGNOSIS — Z1152 Encounter for screening for COVID-19: Secondary | ICD-10-CM | POA: Diagnosis not present

## 2021-07-15 DIAGNOSIS — Z1152 Encounter for screening for COVID-19: Secondary | ICD-10-CM | POA: Diagnosis not present

## 2021-08-12 DIAGNOSIS — Z1152 Encounter for screening for COVID-19: Secondary | ICD-10-CM | POA: Diagnosis not present

## 2021-08-13 DIAGNOSIS — Z1152 Encounter for screening for COVID-19: Secondary | ICD-10-CM | POA: Diagnosis not present

## 2021-08-19 DIAGNOSIS — Z1152 Encounter for screening for COVID-19: Secondary | ICD-10-CM | POA: Diagnosis not present

## 2021-09-06 DIAGNOSIS — Z1152 Encounter for screening for COVID-19: Secondary | ICD-10-CM | POA: Diagnosis not present

## 2021-09-13 DIAGNOSIS — U071 COVID-19: Secondary | ICD-10-CM | POA: Diagnosis not present

## 2024-01-15 DIAGNOSIS — Z1231 Encounter for screening mammogram for malignant neoplasm of breast: Secondary | ICD-10-CM
